# Patient Record
Sex: Female | Born: 1975 | Race: Black or African American | Hispanic: No | Marital: Married | Smoking: Current every day smoker
Health system: Southern US, Community
[De-identification: ages and names within clinical notes are randomized; demographics above are authoritative.]

## PROBLEM LIST (undated history)

## (undated) DIAGNOSIS — I10 Essential (primary) hypertension: Secondary | ICD-10-CM

## (undated) HISTORY — PX: CHOLECYSTECTOMY: SHX55

---

## 1998-03-16 ENCOUNTER — Other Ambulatory Visit: Admission: RE | Admit: 1998-03-16 | Discharge: 1998-03-16 | Payer: Self-pay | Admitting: *Deleted

## 1998-12-27 ENCOUNTER — Other Ambulatory Visit: Admission: RE | Admit: 1998-12-27 | Discharge: 1998-12-27 | Payer: Self-pay | Admitting: *Deleted

## 1999-01-16 ENCOUNTER — Encounter: Admission: RE | Admit: 1999-01-16 | Discharge: 1999-04-16 | Payer: Self-pay | Admitting: Obstetrics & Gynecology

## 1999-06-26 ENCOUNTER — Inpatient Hospital Stay (HOSPITAL_COMMUNITY): Admission: AD | Admit: 1999-06-26 | Discharge: 1999-06-26 | Payer: Self-pay | Admitting: Obstetrics and Gynecology

## 1999-07-06 ENCOUNTER — Inpatient Hospital Stay (HOSPITAL_COMMUNITY): Admission: AD | Admit: 1999-07-06 | Discharge: 1999-07-09 | Payer: Self-pay | Admitting: *Deleted

## 1999-07-15 ENCOUNTER — Encounter: Admission: RE | Admit: 1999-07-15 | Discharge: 1999-10-13 | Payer: Self-pay | Admitting: *Deleted

## 1999-08-15 ENCOUNTER — Other Ambulatory Visit: Admission: RE | Admit: 1999-08-15 | Discharge: 1999-08-15 | Payer: Self-pay | Admitting: *Deleted

## 1999-12-28 ENCOUNTER — Inpatient Hospital Stay (HOSPITAL_COMMUNITY): Admission: EM | Admit: 1999-12-28 | Discharge: 1999-12-30 | Payer: Self-pay | Admitting: Emergency Medicine

## 1999-12-28 ENCOUNTER — Encounter (INDEPENDENT_AMBULATORY_CARE_PROVIDER_SITE_OTHER): Payer: Self-pay | Admitting: Specialist

## 2002-07-08 ENCOUNTER — Other Ambulatory Visit: Admission: RE | Admit: 2002-07-08 | Discharge: 2002-07-08 | Payer: Self-pay | Admitting: *Deleted

## 2003-06-26 ENCOUNTER — Emergency Department (HOSPITAL_COMMUNITY): Admission: EM | Admit: 2003-06-26 | Discharge: 2003-06-26 | Payer: Self-pay | Admitting: Emergency Medicine

## 2004-08-17 ENCOUNTER — Encounter (INDEPENDENT_AMBULATORY_CARE_PROVIDER_SITE_OTHER): Payer: Self-pay | Admitting: *Deleted

## 2004-08-17 ENCOUNTER — Inpatient Hospital Stay (HOSPITAL_COMMUNITY): Admission: RE | Admit: 2004-08-17 | Discharge: 2004-08-20 | Payer: Self-pay | Admitting: Obstetrics and Gynecology

## 2005-04-29 ENCOUNTER — Emergency Department (HOSPITAL_COMMUNITY): Admission: EM | Admit: 2005-04-29 | Discharge: 2005-04-29 | Payer: Self-pay | Admitting: Emergency Medicine

## 2005-09-10 ENCOUNTER — Emergency Department (HOSPITAL_COMMUNITY): Admission: EM | Admit: 2005-09-10 | Discharge: 2005-09-10 | Payer: Self-pay | Admitting: Emergency Medicine

## 2005-09-12 ENCOUNTER — Emergency Department (HOSPITAL_COMMUNITY): Admission: EM | Admit: 2005-09-12 | Discharge: 2005-09-12 | Payer: Self-pay | Admitting: Emergency Medicine

## 2007-10-22 ENCOUNTER — Other Ambulatory Visit: Admission: RE | Admit: 2007-10-22 | Discharge: 2007-10-22 | Payer: Self-pay | Admitting: Gynecology

## 2008-11-28 ENCOUNTER — Emergency Department (HOSPITAL_COMMUNITY): Admission: EM | Admit: 2008-11-28 | Discharge: 2008-11-28 | Payer: Self-pay | Admitting: Emergency Medicine

## 2010-04-30 ENCOUNTER — Emergency Department (HOSPITAL_COMMUNITY)
Admission: EM | Admit: 2010-04-30 | Discharge: 2010-04-30 | Payer: Self-pay | Source: Home / Self Care | Admitting: Family Medicine

## 2010-05-03 ENCOUNTER — Emergency Department (HOSPITAL_COMMUNITY)
Admission: EM | Admit: 2010-05-03 | Discharge: 2010-05-03 | Payer: Self-pay | Source: Home / Self Care | Admitting: Emergency Medicine

## 2010-07-30 LAB — BASIC METABOLIC PANEL
Chloride: 106 mEq/L (ref 96–112)
GFR calc non Af Amer: >60 mL/min (ref >60)
Potassium: 4.5 mEq/L (ref 3.5–5.1)
Sodium: 137 mEq/L (ref 135–145)

## 2010-07-30 LAB — DIFFERENTIAL
Basophils Relative: 1 % (ref 0–1)
Eosinophils Absolute: 0 10*3/uL (ref 0.0–0.7)
Eosinophils Relative: 0 % (ref 0–5)
Lymphocytes Relative: 38 % (ref 12–46)
Monocytes Relative: 6 % (ref 3–12)
Neutro Abs: 2.6 10*3/uL (ref 1.7–7.7)
Neutrophils Relative %: 55 % (ref 43–77)

## 2010-07-30 LAB — POCT CARDIAC MARKERS
CKMB, poc: <1 ng/mL — ABNORMAL LOW (ref 1.0–8.0)
Troponin i, poc: <0.05 ng/mL (ref 0.00–0.09)

## 2010-07-30 LAB — CBC
HCT: 26.8 % — ABNORMAL LOW (ref 36.0–46.0)
Hemoglobin: 7.7 g/dL — ABNORMAL LOW (ref 12.0–15.0)
MCV: 65.4 fL — ABNORMAL LOW (ref 78.0–100.0)
WBC: 4.9 10*3/uL (ref 4.0–10.5)

## 2010-10-05 NOTE — Op Note (Signed)
[facility]  Patient:    [name], [name]                     MRN: [id number] Proc. Date: 12/29/99 Adm. Date:  [id number] Disc. Date: [id number] Attending:  Charlton Haws CC:         [facility].   Operative Report  PREOPERATIVE DIAGNOSIS:  Early acute calculous cholecystitis.  POSTOPERATIVE DIAGNOSIS:  Early acute calculous cholecystitis.  OPERATION PERFORMED:  Laparoscopic cholecystectomy.  SURGEON:  Dr. Jamey Ripa.  ASSISTANT:  Dr. Zachery Dakins.  ANESTHESIA:  General endotracheal.  CLINICAL HISTORY:  The patient is a 35 year old whose had a several day history of abdominal pain which has been almost a daily basis. She was having pain all day on the day of admission, yesterday, with associated nausea and vomiting although feeling a little bit better by the time I saw her in the emergency room around 7 p.m.  She was admitted with known gallstones and thinking she was in the process of either having just a long biliary colic or developing early cholecystitis.  DESCRIPTION OF PROCEDURE:  The patient was brought to the operating room and after satisfactory general endotracheal anesthesia had been obtained, the abdomen was prepped and draped. I used 0.25% plain Marcaine at each incision and made an umbilical incision. The fascia was grasped with a Kocher and opened and the peritoneal cavity entered under direct vision. A pursestring was placed and the Hasson introduced and the abdomen insufflated to 15. The gallbladder was ______ distended, a little bit of edematous and minimally thick walled but no gangrene or other active inflammatory changes.  With the patient in reverse Trendelenburg and tilted to the left, 3 additional cannulas were placed in the usual positions. Omental adhesions were divided and pulled off of the gallbladder ______ exposed the area of the cystic duct. I saw the cystic artery behind the cystic duct and once  I had the cystic duct cleaned off and the triangle of Calot opened up enough to make sure there were no other ductal structures back here, I went ahead and divided the cystic duct after first putting a clip on it and opening it and making sure that no stones could milk out of the cystic duct. Two clips were placed on the stay side and the gallbladder divided. The anterior and posterior branches of the artery were divided with clips and the gallbladder removed from below to above. There were a couple of bleeding points along the edges of the gallbladder which were also clipped as we came out including one right off the very liver edge. Once the gallbladder was removed, I irrigated and made sure everything was dry. I brought the gallbladder out the umbilical port. There were multiple stones in the gallbladder and I had to open the gallbladder and manually break up the stones with sponge forceps and then I was able to extract the gallbladder intact. The umbilical port was manually occluded for a few moments so I irrigated again. When I removed the 2 lateral ports, the more lateral of the 2 had some arterial bleeding which I couldnt readily control with cautery so I introduced the Hasson and put a suture across the using the endoclose device and a 2-0 Vicryl. This controlled the bleeding nicely and I watched the area for several minutes while I continued to irrigate and make sure everything was as dry as possible. Once I felt that everything was dry, the umbilical port was  removed and tied with the pursestring tied down. The abdomen was deflated through the epigastric port. The skin was closed with 4-0 monocryl subcuticular plus Steri-Strips. The patient tolerated the procedure well. There were no operative complications. All counts were correct. DD:  12/29/99 TD:  12/31/99 Job: [id number]

## 2010-10-05 NOTE — H&P (Signed)
[facility]  Patient:    [name], [name]                     MRN: [id number] Adm. Date:  [id number] Attending:  Charlton Haws CC:         [facility]   History and Physical  CHIEF COMPLAINT:  Abdominal pain.  HISTORY OF PRESENT ILLNESS:  Ms. [name] is a 35 year old seen by me in the emergency room for abdominal pain, nausea and vomiting.  This current episode began about 8:30 this morning with epigastric pain, bloating, and nausea, and every time she tries to eat, she has to throw it back up.  By the time she got to the emergency room this afternoon and I had seen her, it had begun to ease off somewhat.  The nausea is also somewhat better.  She notes that she first began having some problems shortly after the birth of her second child, which was five months ago and occurred by C-section.  The pain began as just some mild epigastric discomfort and ingestion, gas, bloatedness that would last about an hour, and she could not relate it to any particular types of foods that she ate.  After the last couple of weeks, it has become increasingly more significant, and this week she has been having almost daily episodes.  She had a gallbladder ultrasound done two days ago, which reportedly shows gallstones but no evidence of acute cholecystitis.  She was scheduled to see one of my associates, Dr. Samuella Cota, in the office next week, but because of increasing pain today is seen in the emergency department.  PAST MEDICAL HISTORY:  Operations:  C-section x 2.  MEDICATIONS:  Birth control pills.  ALLERGIES:  None known.  SOCIAL HISTORY:  Smokes occasionally.  Alcohol:  None.  FAMILY HISTORY:  Positive in her mother and sister for gallbladder disease, and the father has heart disease.  REVIEW OF SYSTEMS:  HEENT negative.  CHEST:  No cough or shortness of breath. HEART:  No history of murmurs or hypertension.  ABDOMEN:  Negative except  for HPI.  GU:  Negative.  EXTREMITIES:  Negative.  PHYSICAL EXAMINATION:  GENERAL:  The patient is a healthy 35 year old who is currently in no distress.  She was awake, alert and oriented.  HEENT:  The head is normocephalic.  The eyes are nonicteric.  NECK:  Supple.  There are no masses or thyromegaly.  LUNGS:  Clear to auscultation.  HEART:  Regular, no murmurs, rubs, or gallops are heard.  ABDOMEN:  Soft and nondistended.  There is minimal tenderness to deep palpation of the right upper quadrant but very minimal, and there is no guarding or rebound.  No masses are palpable, no hepatosplenomegaly noted.  No other masses noted within the abdomen.  EXTREMITIES:  Normal with no cyanosis or edema.  Pulses intact.  LABORATORY DATA:  So far available is only a white count of 8400 with a hemoglobin of 9.7.  IMPRESSION: 1. Abdominal pain secondary to biliary colic and gallstones. 2. Mild anemia.  PLAN:  We will want to do a cholecystectomy on this lady and will try to get this done fairly soon but wish to wait on timing to see how she does this evening and to see what her CMET, amylase lab results are. DD:  12/28/99 TD:  12/29/99 Job: [id number]

## 2010-10-05 NOTE — H&P (Signed)
[facility]  Patient:    [name], [name]                   MRN: [id number] Adm. Date:  [id number] Attending:  Donne Hazel                         History and Physical  HISTORY OF PRESENT ILLNESS:   Ms. Danish is a 35 year old female gravida 2 para 1 at term, admitted for repeat cesarean section.  Her previous cesarean section was for CPD.  She declined a trial of labor, and is thus admitted for repeat cesarean delivery.  Her pregnancy has been uncomplicated.  OBSTETRICAL LABORATORY:       Maternal blood type is B positive, rubella immune. Group B strep was positive.  Glucola was normal.  MEDICAL HISTORY:              None.  SURGICAL HISTORY:             Cesarean section - 1997.  OBSTETRICAL HISTORY:          Cesarean section in 1997 for CPD.  CURRENT MEDICATIONS:          Prenatal vitamins.  ALLERGIES:                    None known.  PHYSICAL EXAMINATION:  VITAL SIGNS:                  Stable, blood pressure 143/80, temperature 97.3, fetal heart tones 136.  GENERAL:                      She is a well-developed, well-nourished female in no acute distress.  HEENT:                        Within normal limits.  NECK:                         Supple without adenopathy or thyromegaly.  HEART AND LUNGS:              Clear.  BREAST:                       Deferred.  ABDOMEN:                      Gravid and nontender.  EXTREMITIES AND NEUROLOGIC:   Grossly normal.  PELVIC:                       Deferred.  ADMITTING DIAGNOSES:          1. Intrauterine pregnancy at term.                               2. Repeat cesarean section.                               3. Declined a trial of labor.  PLAN:                         Repeat low transverse cesarean section.  DISCUSSION:  Risks and benefits of the surgery explained to the the patient.  She was allowed to ask questions and wished to proceed.  She declined a  trial of labor and bilateral tubal ligation. DD:  07/06/99 TD:  07/06/99 Job: [id number]

## 2010-10-05 NOTE — Op Note (Signed)
[name], [name]            ACCOUNT NO.:  [id number]   MEDICAL RECORD NO.:  [id number]          PATIENT TYPE:  INP   LOCATION:  9198                          FACILITY:  [facility]   PHYSICIAN:  Richardean Sale, M.D.   DATE OF BIRTH:  1975/12/23   DATE OF PROCEDURE:  08/17/2004  DATE OF DISCHARGE:                                 OPERATIVE REPORT   PREOPERATIVE DIAGNOSES:  1.  Thirty-nine plus intrauterine pregnancy, for repeat cesarean section.  2.  Desires permanent sterilization.   POSTOPERATIVE DIAGNOSES:  1.  Thirty-nine plus intrauterine pregnancy, for repeat cesarean section.  2.  Desires permanent sterilization.   PROCEDURES:  1.  Repeat cesarean section.  2.  Bilateral tubal ligation.   SURGEON:  Richardean Sale, M.D.   ASSISTANT:  Pershing Cox, M.D.   ANESTHESIA:  Spinal.   COMPLICATIONS:  None.   ESTIMATED BLOOD LOSS:  700 mL.   IV FLUIDS:  3000 mL.   URINE OUTPUT:  125 mL clear urine at the end of the procedure.   SPECIMENS:  Placenta sent to pathology.   OPERATIVE FINDINGS:  A viable female infant with Apgars of 9 and 9, vertex.  Intact placenta with three-vessel cord.  Arterial cord pH of 7.3.  Normal-  appearing ovaries and fallopian tubes with very thin adhesions surrounding  the fallopian tubes.  Very dense scarring of the rectus muscles and the  fascia from prior surgery.  Very thin lower uterine segment.   INDICATIONS:  This is a 35 year old gravida 3, para 2-0-0-2, black female at  39+ weeks' gestation by last menstrual period consistent with ultrasound,  who presents today for repeat cesarean section.  The patient also desires  permanent sterilization, as she desires no future childbearing.  The patient  has been counseled on the risks and benefits of cesarean section, which  include but are not limited to hemorrhage requiring transfusion, infection,  injury to the bowel or the bladder or the ureters or other intra-abdominal  organs which  could require additional surgery, anesthetic related  complications and DVT.  Of note, we also discussed the risks of tubal  ligation, which include all of the above as well as a potential failure rate  of 1%.  The patient voiced an understanding of all these risks, and informed  consent has been obtained.   PROCEDURE:  The patient was taken to the operating room, where she was given  a spinal anesthetic.  She was then prepped and draped in the usual sterile  fashion and placed in the supine position with a leftward tilt.  Marcaine  0.25% 10 mL were injected over the patient's previous Pfannenstiel skin  incision.  A Pfannenstiel skin incision was then made through this previous  scar with the scalpel.  This was carried down sharply to the fascia.  The  fascia was then incised bilaterally with the Mayo scissors and the superior  and inferior aspect of the fascial incision were grasped with Kocher clamps,  elevated, and the underlying rectus muscles were dissected off with the Mayo  scissors and use of the Bovie.  There was  some thick scar of the superior  aspect of the fascia to the rectus muscles, and the rectus muscles were  tightly scarred together.  Therefore, a knife was then used to very  carefully incise the rectus muscles in the midline until the muscle could be  separated and the peritoneum could be identified.  The peritoneum was  entered with a knife.  The rectus muscles were then adequately separated in  the midline using sharp dissection to separate the scar.  The peritoneal  incision was then extended superiorly and inferiorly with good visualization  of the bladder.  The bladder blade was then reinserted and the vesicouterine  peritoneum was identified, grasped with pick-ups, and entered sharply with  Metzenbaum scissors.  This incision was then extended laterally and bladder  flap created digitally.   The bladder blade was then reinserted.  The lower uterine segment was   inspected and was very thin.  This was incised in a transverse fashion with  the scalpel and was extended manually.  The bladder blade was then removed  and the infant's head was then delivered through the incision  atraumatically.  The nose and mouth were suctioned with the bulb and the  infant was then delivered to the sterile field and the cord was clamped and  cut.  The infant was then handed off to the waiting NICU attendants with a  vigorous cry.  Cord gas was sent, which was 7.3.  Apgars were 9 and 9.  Cord  blood was obtained.  The placenta was then removed and sent to pathology.  The uterus was then carefully inspected and any remaining placental  fragments were removed from the uterine cavity.  At this point the uterus  was exteriorized and the uterus was cleared of all clot and debris.  The  uterine incision was then repaired in a running locked fashion with Chromic  suture.  A second layer of the same suture was then placed in an imbricating  fashion to help support the suture line given that the lower uterine segment  was extremely thin.  Once the uterine incision was closed and the incision  was hemostatic, attention was then turned to the tubal ligation.  Both  ovaries appeared normal.  The fallopian tubes were normal.  There were some  very thin, filmy adhesions of the fallopian tubes to the ovaries.  These  were removed with the Bovie and were hemostatic.  Tubal ligation was then  performed by grasping each tube in the midportion and carrying it out to the  fimbriated end.  The Bovie was then used to make a window in the  mesosalpinx, and a 3 cm segment of the tube was then tied off on both sides  and the tube was removed with the Metzenbaum scissors.  These specimens were  then sent to pathology labeled as portion of left fallopian tube and  portion of right fallopian tube.  The tubal ligation site was hemostatic. The uterine incision was hemostatic, and the uterus was then  returned to the  abdomen.  The pelvis was then irrigated copiously with warm normal saline.  The uterine incision was inspected and was hemostatic.  The peritoneal  surfaces were inspected and any areas of bleeding were then cauterized with  the Bovie.  The muscles and subfascial areas were inspected.  Any areas of  bleeding were cauterized with Bovie.  At this point when hemostasis was  assured, the fascia was then closed in a running  fashion with Vicryl suture.  The subcutaneous space was then irrigated.  Any areas of bleeding were  cauterized with the Bovie and the skin incision was then closed with  staples.   The patient tolerated the procedure well.  All sponge, lap and instruments  were correct x2.  She was taken to the recovery room awake and in stable  condition, and there were no complications.  Infant and mother are doing  well at the time of this dictation.      JW/MEDQ  D:  08/17/2004  T:  08/17/2004  Job:  [id number]

## 2010-10-05 NOTE — Discharge Summary (Signed)
[name], [name]            ACCOUNT NO.:  [id number]   MEDICAL RECORD NO.:  [id number]          PATIENT TYPE:  INP   LOCATION:  9105                          FACILITY:  [facility]   PHYSICIAN:  Richardean Sale, M.D.   DATE OF BIRTH:  Oct 22, 1975   DATE OF ADMISSION:  08/17/2004  DATE OF DISCHARGE:  08/20/2004                                 DISCHARGE SUMMARY   ADMISSION DIAGNOSES:  1.  39+ week intrauterine pregnancy.  2.  For repeat cesarean section.   DISCHARGE DIAGNOSES:  1.  39+ week intrauterine pregnancy.  2.  For repeat cesarean section.   PROCEDURE:  Repeat cesarean section with tubal ligation on August 17, 2004.   FINDINGS:  A viable female infant with Apgars of 9 and 9.  Normal placenta  and arterial cord pH of 7.3.   HISTORY OF PRESENT ILLNESS:  Please see admission history and physical for  complete details.  Briefly, this is a 35 year old African-American female,  gravida 3, para 2-0-0-2, admitted at [redacted] weeks gestation for repeat cesarean  section.  The patient also voiced desire for permanent sterilization.   HOSPITAL COURSE:  The patient underwent an uncomplicated repeat cesarean  section with bilateral partial salpingectomy on August 17, 2004.  The  patient's postoperative course was unremarkable.  She remained afebrile  throughout her hospitalization.  On postoperative day #3, she was  ambulating, voiding without difficulty, tolerating a regular diet, and her  pain was controlled with oral pain medication.  She was discharged to home  on postoperative day #3 in good condition.   DISPOSITION:  To home.   CONDITION ON DISCHARGE:  Good.   FOLLOW UP:  The patient will follow up in 4 to 6 weeks for routine  postoperative visit.   DISCHARGE MEDICATIONS:  1.  Percocet one to two tablets p.o. q.4-6h p.r.n. pain.  2.  Motrin 800 mg p.o. q.8h p.r.n. pain.  3.  Ferrous sulfate one tablet p.o. daily.   DISCHARGE LABORATORY DATA:  Hemoglobin 8.0, hematocrit 23.9,  platelet count  225, white count 15.4.      JW/MEDQ  D:  09/22/2004  T:  09/23/2004  Job:  [id number]

## 2010-10-05 NOTE — H&P (Signed)
[name], [name]            ACCOUNT NO.:  [id number]   MEDICAL RECORD NO.:  [id number]          PATIENT TYPE:  INP   LOCATION:  NA                            FACILITY[facility]  [facility]   PHYSICIAN:  [name], M.D.   DATE OF BIRTH:  1976/04/05   DATE OF ADMISSION:  DATE OF DISCHARGE:                                HISTORY & PHYSICAL   DATE OF PLANNED PROCEDURE:  August 17, 2004   PREOPERATIVE DIAGNOSIS:  Thirty-nine-plus-week intrauterine pregnancy with  history of cesarean section x2, unexplained elevated alpha-fetoprotein, and  desires permanent sterilization; for repeat cesarean section.   PLANNED PROCEDURE:  Repeat cesarean section with bilateral tubal ligation.   HISTORY OF PRESENT ILLNESS:  This is a 35 year old gravida 3 para 2-0-0-2  black female at 39+ weeks gestation by last menstrual period consistent with  ultrasound who presents for repeat cesarean section. The patient has a  history of two prior cesarean sections and also desires permanent  sterilization. The patient's pregnancy has been complicated by an  unexplained elevated AFP. She has undergone weekly testing with nonstress  tests in the third trimester, all of which have been reactive. Ultrasound  has shown appropriate growth with no anatomical abnormalities. The patient  was offered amniocentesis for diagnosis of elevated AFP but declined.  Prenatal care has been at [facility] with Dr. Richardean Keller as the  primary attending. Today, the patient reports good movement, denies loss of  fluid or vaginal bleeding, and complains of rare mild contractions.   PAST OBSTETRIC HISTORY:  1.  Gravida 1:  C-section at 39+ weeks for failure to progress.  2.  Gravida 2:  Repeat C-section, no complications.  3.  Gravida 3:  Current. History of positive group B beta strep.   GYNECOLOGIC HISTORY:  Remote history of abnormal Pap smear, no treatment  needed. No history of sexually-transmitted infections.   PAST MEDICAL  HISTORY:  History of gestational anemia. No prior  hospitalizations with the exception of cesarean section and gallbladder  surgery.   PAST SURGICAL HISTORY:  1.  C-section x2.  2.  Cholecystectomy.   FAMILY HISTORY:  Daughter has sickle cell trait. The patient's hemoglobin  electrophoresis was normal. Positive history of hypertension, coronary  artery disease, type 2 diabetes, stroke, and ovarian cancer in an aunt. No  known birth defects, congenital anomalies, trisomy, spinal bifida, or cystic  fibrosis.   SOCIAL HISTORY:  Former smoker. Denies alcohol or drugs during pregnancy. Is  married.   MEDICATIONS:  Prenatal vitamins.   ALLERGIES:  No known drug allergies.   DATA:  Prenatal laboratory studies:  Blood type is B positive, antibody  screen negative. Sickle cell screen within normal limits. RPR nonreactive.  Rubella immune. Hepatitis B surface antigen nonreactive. HIV nonreactive.  One-hour Glucola 121. Group B beta strep positive. Triple screen showed  borderline elevated AFP at 2.19 multiples of median; Down's syndrome risk  and trisomy 18 risk were not elevated. Repeat AFP was 2.13 multiples of  median, which is still borderline.   PHYSICAL EXAMINATION:  VITAL SIGNS:  She is afebrile, vital signs are  stable.  GENERAL:  She is a well-developed, well-nourished black female who is no  acute distress.  HEART:  Regular rate and rhythm.  LUNGS:  Clear to auscultation.  ABDOMEN:  Gravid, fundal height is 39.  PELVIC:  Cervix is 1 cm, 70% effaced, -1 station, and vertex.  EXTREMITIES:  1+ edema bilaterally, nontender.  NEUROLOGIC:  Nonfocal.   ASSESSMENT:  A 35 year old gravida 3 para 2 black female at 39+ weeks  gestation with history of cesarean section x2 for repeat cesarean section  and permanent sterilization.   PLAN:  Will proceed with repeat cesarean section and permanent  sterilization. Risks/benefits of the surgery is reviewed with the patient in  detail. We  discussed risks of hemorrhage, infection, injury to the bowel or  the bladder or other abdominal organs which could require additional  surgery. Reviewed anesthetic-related complications and reviewed the  possibility of tubal failure and slightly increased risk of ectopic  pregnancy should pregnancy occur. Reviewed with the patient that tubal  ligation is permanent. The patient voiced an understanding of all the above  and desires to proceed. Informed consent obtained.      JW/MEDQ  D:  08/15/2004  T:  08/15/2004  Job:  [id number]

## 2010-10-05 NOTE — Op Note (Signed)
[facility]  Patient:    SPRING, SAN                   MRN: [id number] Proc. Date: 07/06/99 Adm. Date:  [id number] Attending:  Donne Hazel                           Operative Report  PREOPERATIVE DIAGNOSES:       1. Intrauterine pregnancy at term.                               2. Repeat cesarean section.  POSTOPERATIVE DIAGNOSES:      1. Intrauterine pregnancy at term.                               2. Repeat cesarean section.  OPERATION:                    Repeat low transverse cesarean section.  SURGEON:                      Willey Blade, M.D.  ANESTHESIA:                   Spinal.  ESTIMATED BLOOD LOSS:         800 cc.  COMPLICATIONS:                None.  FINDINGS:                     At 1328 through a low transverse uterine incision, a viable female infant was delivered with Apgars of 8 and 9.  Weight of 7 pounds nd 13 ounces.  The pelvis was visualized at the time of surgery and noted to be normal.  DESCRIPTION OF PROCEDURE:     The patient was taken to the operating room where a spinal anesthetic was administered.  The patient was placed on the operating table in the left lateral tilt position.  The abdomen was prepped and draped in the usual sterile fashion with Betadine and sterile drapes.  A Foley catheter was sterilely inserted.  The abdomen was entered.  A Pfannenstiel incision was carried down sharply in the usual fashion.  The peritoneum was atraumatically entered.  The vesicouterine peritoneum overlying the lower uterine segment was incised and a bladder flap was bluntly and sharply created over the lower uterine segment.  A  bladder blade was then placed behind the bladder.  The uterus was then entered through a low transverse incision and carried out laterally in the operators fingers.  The membranes were entered with clear fluid noted.  The vertex was elevated into the incision and delivered properly at  1328.  The oral and nasopharynx was thoroughly bulb suctioned.  The cord doubly clamped and cut, and the baby handed promptly to the pediatrician.  The baby was a female weighing  7 pounds and 13 ounces.  Apgars were 8 and 9.                                The placenta was then manually distracted intact ith three vessel cord without difficulty.  The interior of the uterus was wiped clean thoroughly with a wet  sponge.  The uterine incision was then closed in a two layered fashion.  The first layer a running interlocking suture of #1 Vicryl.  second imbricating suture was placed across the primary suture line with a running stitch of #1 Vicryl as well.  The pelvis was then thoroughly irrigated with copious amounts of irrigant and hemostasis was noted.  The rectus muscle and anterior peritoneum were then closed with a running stitch of #1 Vicryl.  Good hemostasis was noted in the subfascial layers.  The fascia was then closed with two sutures of #1 Vicryl.  This was done by placing two sutures ________ incision and with two  running stitches meeting in the midline.  The subcutaneous tissue was irrigated and made hemostatic using Bovie cautery.  The skin reapproximated with staples and  sterile dressing applied.                                Final sponge, needle, and instrument count was correct x 3.  The patient received 1 g of Cefotan after cord clamp.  There were no perioperative complications. DD:  07/06/99 TD:  07/08/99 Job: [id number]

## 2013-08-09 ENCOUNTER — Encounter (HOSPITAL_COMMUNITY): Payer: Self-pay | Admitting: Emergency Medicine

## 2013-08-09 ENCOUNTER — Emergency Department (HOSPITAL_COMMUNITY)
Admission: EM | Admit: 2013-08-09 | Discharge: 2013-08-09 | Disposition: A | Discharge status: Home / Self Care | Payer: 59 | Source: Home / Self Care | Attending: Family Medicine | Admitting: Family Medicine

## 2013-08-09 DIAGNOSIS — R599 Enlarged lymph nodes, unspecified: Secondary | ICD-10-CM

## 2013-08-09 DIAGNOSIS — R590 Localized enlarged lymph nodes: Secondary | ICD-10-CM

## 2013-08-09 HISTORY — DX: Essential (primary) hypertension: I10

## 2013-08-09 NOTE — ED Provider Notes (Signed)
Susan SledgeJennifer Keller is a [age] y.o. female who presents to Urgent Care today for tender lymph node behind the left ear present for one day. No cough congestion runny nose fevers or chills. Patient feels well otherwise. No night sweats or unexpected weight loss. Patient feels well otherwise.   Past Medical History  Diagnosis Date  . Hypertension    History  Substance Use Topics  . Smoking status: Current Every Day Smoker  . Smokeless tobacco: Not on file  . Alcohol Use: Yes   ROS as above Medications: No current facility-administered medications for this encounter.   Current Outpatient Prescriptions  Medication Sig Dispense Refill  . lisinopril-hydrochlorothiazide (PRINZIDE,ZESTORETIC) 10-12.5 MG per tablet Take 1 tablet by mouth daily.        Exam:  BP 150/85  Pulse 68  Temp(Src) 98.6 F (37 C) (Oral)  Resp 14  SpO2 100% Gen: Well NAD HEENT: EOMI,  MMM small left-sided posterior regular lymph node mildly tender. Less than 1 cm in diameter. History pharynx and tympanic membranes are normal. No scalp rashes. Lungs: Normal work of breathing. CTABL Heart: RRR no MRG Abd: NABS, Soft. NT, ND Exts: Brisk capillary refill, warm and well perfused.    Assessment and Plan: 38 y.o. female with lymphadenopathy. Unclear etiology. Followup with primary care provider.  Discussed warning signs or symptoms. Please see discharge instructions. Patient expresses understanding.    Rodolph BongEvan S Corey, MD 08/09/13 1321

## 2013-08-09 NOTE — ED Notes (Signed)
C/o noted painful lump on her left mastoid area yesterday. Denies any other area causing any pain or discomfort

## 2013-08-09 NOTE — Discharge Instructions (Signed)
Thank you for coming in today. You have a reactive lymph node.  This is normal.  Come back or go to your regular Dr. if it gets a lot bigger or starts changing a lot.    Lymphadenopathy Lymphadenopathy means "disease of the lymph glands." But the term is usually used to describe swollen or enlarged lymph glands, also called lymph nodes. These are the bean-shaped organs found in many locations including the neck, underarm, and groin. Lymph glands are part of the immune system, which fights infections in your body. Lymphadenopathy can occur in just one area of the body, such as the neck, or it can be generalized, with lymph node enlargement in several areas. The nodes found in the neck are the most common sites of lymphadenopathy. CAUSES  When your immune system responds to germs (such as viruses or bacteria ), infection-fighting cells and fluid build up. This causes the glands to grow in size. This is usually not something to worry about. Sometimes, the glands themselves can become infected and inflamed. This is called lymphadenitis. Enlarged lymph nodes can be caused by many diseases:  Bacterial disease, such as strep throat or a skin infection.  Viral disease, such as a common cold.  Other germs, such as lyme disease, tuberculosis, or sexually transmitted diseases.  Cancers, such as lymphoma (cancer of the lymphatic system) or leukemia (cancer of the white blood cells).  Inflammatory diseases such as lupus or rheumatoid arthritis.  Reactions to medications. Many of the diseases above are rare, but important. This is why you should see your caregiver if you have lymphadenopathy. SYMPTOMS   Swollen, enlarged lumps in the neck, back of the head or other locations.  Tenderness.  Warmth or redness of the skin over the lymph nodes.  Fever. DIAGNOSIS  Enlarged lymph nodes are often near the source of infection. They can help healthcare providers diagnose your illness. For instance:    Swollen lymph nodes around the jaw might be caused by an infection in the mouth.  Enlarged glands in the neck often signal a throat infection.  Lymph nodes that are swollen in more than one area often indicate an illness caused by a virus. Your caregiver most likely will know what is causing your lymphadenopathy after listening to your history and examining you. Blood tests, x-rays or other tests may be needed. If the cause of the enlarged lymph node cannot be found, and it does not go away by itself, then a biopsy may be needed. Your caregiver will discuss this with you. TREATMENT  Treatment for your enlarged lymph nodes will depend on the cause. Many times the nodes will shrink to normal size by themselves, with no treatment. Antibiotics or other medicines may be needed for infection. Only take over-the-counter or prescription medicines for pain, discomfort or fever as directed by your caregiver. HOME CARE INSTRUCTIONS  Swollen lymph glands usually return to normal when the underlying medical condition goes away. If they persist, contact your health-care provider. He/she might prescribe antibiotics or other treatments, depending on the diagnosis. Take any medications exactly as prescribed. Keep any follow-up appointments made to check on the condition of your enlarged nodes.  SEEK MEDICAL CARE IF:   Swelling lasts for more than two weeks.  You have symptoms such as weight loss, night sweats, fatigue or fever that does not go away.  The lymph nodes are hard, seem fixed to the skin or are growing rapidly.  Skin over the lymph nodes is red and inflamed.  This could mean there is an infection. SEEK IMMEDIATE MEDICAL CARE IF:   Fluid starts leaking from the area of the enlarged lymph node.  You develop a fever of 102 F (38.9 C) or greater.  Severe pain develops (not necessarily at the site of a large lymph node).  You develop chest pain or shortness of breath.  You develop worsening  abdominal pain. MAKE SURE YOU:   Understand these instructions.  Will watch your condition.  Will get help right away if you are not doing well or get worse. Document Released: 02/13/2008 Document Revised: 07/29/2011 Document Reviewed: 02/13/2008 [facility] Patient Information 2014 [place], [place].

## 2014-05-08 ENCOUNTER — Emergency Department (HOSPITAL_COMMUNITY): Payer: 59

## 2014-05-08 ENCOUNTER — Encounter (HOSPITAL_COMMUNITY): Payer: Self-pay | Admitting: Emergency Medicine

## 2014-05-08 ENCOUNTER — Emergency Department (HOSPITAL_COMMUNITY)
Admission: EM | Admit: 2014-05-08 | Discharge: 2014-05-08 | Disposition: A | Discharge status: Home / Self Care | Payer: 59 | Attending: Emergency Medicine | Admitting: Emergency Medicine

## 2014-05-08 DIAGNOSIS — Z720 Tobacco use: Secondary | ICD-10-CM | POA: Insufficient documentation

## 2014-05-08 DIAGNOSIS — R1011 Right upper quadrant pain: Secondary | ICD-10-CM | POA: Diagnosis present

## 2014-05-08 DIAGNOSIS — K5909 Other constipation: Secondary | ICD-10-CM | POA: Insufficient documentation

## 2014-05-08 DIAGNOSIS — I10 Essential (primary) hypertension: Secondary | ICD-10-CM | POA: Insufficient documentation

## 2014-05-08 DIAGNOSIS — Z79899 Other long term (current) drug therapy: Secondary | ICD-10-CM | POA: Diagnosis not present

## 2014-05-08 DIAGNOSIS — R109 Unspecified abdominal pain: Secondary | ICD-10-CM

## 2014-05-08 MED ORDER — POLYETHYLENE GLYCOL 3350 17 G PO PACK: 17.0000 g | PACK | Freq: Every day | ORAL | Status: AC

## 2014-05-08 NOTE — ED Provider Notes (Signed)
CSN: [id number]     Arrival date & time 05/08/14  [id number] History   First MD Initiated Contact with Patient 05/08/14 [phone]     Chief Complaint  Patient presents with  . Abdominal Pain     (Consider location/radiation/quality/duration/timing/severity/associated sxs/prior Treatment) Patient is a 38 y.o. female presenting with abdominal pain. The history is provided by the patient. No language interpreter was used.  Abdominal Pain Pain location:  Epigastric and RUQ Pain quality: aching   Pain radiates to:  Does not radiate Pain severity:  Moderate Onset quality:  Gradual Duration: 18 Month. Timing:  Constant Progression:  Worsening Chronicity:  New Relieved by:  Nothing Worsened by:  Nothing tried Ineffective treatments:  None tried Associated symptoms: no chest pain, no nausea and no vomiting   Risk factors: no alcohol abuse     Past Medical History  Diagnosis Date  . Hypertension    Past Surgical History  Procedure Laterality Date  . Cesarean section    . Cholecystectomy     History reviewed. No pertinent family history. History  Substance Use Topics  . Smoking status: Current Every Day Smoker -- 1.00 packs/day    Types: Cigarettes  . Smokeless tobacco: Not on file  . Alcohol Use: Yes   OB History    No data available     Review of Systems  Cardiovascular: Negative for chest pain.  Gastrointestinal: Positive for abdominal pain. Negative for nausea and vomiting.      Allergies  Review of patient's allergies indicates no known allergies.  Home Medications   Prior to Admission medications   Medication Sig Start Date End Date Taking? Authorizing Provider  lisinopril-hydrochlorothiazide (PRINZIDE,ZESTORETIC) 10-12.5 MG per tablet Take 1 tablet by mouth daily.    Historical Provider, MD   BP 141/83 mmHg  Pulse 98  Temp(Src) 98.9 F (37.2 C) (Oral)  Resp 20  Ht 5\' 4"  (1.626 m)  Wt 228 lb (103.42 kg)  BMI 39.12 kg/m2  SpO2 100%  LMP 04/25/2014 Physical  Exam  Constitutional: She is oriented to person, place, and time. She appears well-developed and well-nourished.  HENT:  Head: Normocephalic.  Eyes: EOM are normal. Pupils are equal, round, and reactive to light.  Neck: Normal range of motion.  Cardiovascular: Normal heart sounds.   Pulmonary/Chest: Effort normal and breath sounds normal.  Abdominal: Soft. She exhibits no distension.  Musculoskeletal: Normal range of motion.  Neurological: She is alert and oriented to person, place, and time.  Skin: Skin is warm.  Psychiatric: She has a normal mood and affect.  Nursing note and vitals reviewed.   ED Course  Procedures (including critical care time) Labs Review Labs Reviewed - No data to display  Imaging Review No results found.   EKG Interpretation None      MDM   Final diagnoses:  Abdominal pain  Other constipation    Pt given rx for miralax I advised schedule to see Gi doctor for evaluation    Elson AreasLeslie K Marieli Rudy, PA-C 05/08/14 1051  Rolland PorterMark James, MD 05/09/14 [phone]

## 2014-05-08 NOTE — ED Notes (Signed)
Patient transported to X-ray 

## 2014-05-08 NOTE — ED Notes (Signed)
Patient returned from X-ray 

## 2014-05-08 NOTE — ED Notes (Addendum)
Stomach pain on left side that feels shooting and straining from side over to umbilicus. Has been having stomach pain for a year and a half. PCP diagnosed acid reflux. Nothing new or different about the pain today, but she feels like it is more than reflux and wants the pain to be evaluated by a different doctor other than PCP. Denies N/V/D. Reports frequent constipation. Denies pain at this time; it is intermittent.

## 2014-05-08 NOTE — Discharge Instructions (Signed)
Abdominal Pain, Women °Abdominal (stomach, pelvic, or belly) pain can be caused by many things. It is important to tell your doctor: °· The location of the pain. °· Does it come and go or is it present all the time? °· Are there things that start the pain (eating certain foods, exercise)? °· Are there other symptoms associated with the pain (fever, nausea, vomiting, diarrhea)? °All of this is helpful to know when trying to find the cause of the pain. °CAUSES  °· Stomach: virus or bacteria infection, or ulcer. °· Intestine: appendicitis (inflamed appendix), regional ileitis (Crohn's disease), ulcerative colitis (inflamed colon), irritable bowel syndrome, diverticulitis (inflamed diverticulum of the colon), or cancer of the stomach or intestine. °· Gallbladder disease or stones in the gallbladder. °· Kidney disease, kidney stones, or infection. °· Pancreas infection or cancer. °· Fibromyalgia (pain disorder). °· Diseases of the female organs: °¨ Uterus: fibroid (non-cancerous) tumors or infection. °¨ Fallopian tubes: infection or tubal pregnancy. °¨ Ovary: cysts or tumors. °¨ Pelvic adhesions (scar tissue). °¨ Endometriosis (uterus lining tissue growing in the pelvis and on the pelvic organs). °¨ Pelvic congestion syndrome (female organs filling up with blood just before the menstrual period). °¨ Pain with the menstrual period. °¨ Pain with ovulation (producing an egg). °¨ Pain with an IUD (intrauterine device, birth control) in the uterus. °¨ Cancer of the female organs. °· Functional pain (pain not caused by a disease, may improve without treatment). °· Psychological pain. °· Depression. °DIAGNOSIS  °Your doctor will decide the seriousness of your pain by doing an examination. °· Blood tests. °· X-rays. °· Ultrasound. °· CT scan (computed tomography, special type of X-ray). °· MRI (magnetic resonance imaging). °· Cultures, for infection. °· Barium enema (dye inserted in the large intestine, to better view it with  X-rays). °· Colonoscopy (looking in intestine with a lighted tube). °· Laparoscopy (minor surgery, looking in abdomen with a lighted tube). °· Major abdominal exploratory surgery (looking in abdomen with a large incision). °TREATMENT  °The treatment will depend on the cause of the pain.  °· Many cases can be observed and treated at home. °· Over-the-counter medicines recommended by your caregiver. °· Prescription medicine. °· Antibiotics, for infection. °· Birth control pills, for painful periods or for ovulation pain. °· Hormone treatment, for endometriosis. °· Nerve blocking injections. °· Physical therapy. °· Antidepressants. °· Counseling with a psychologist or psychiatrist. °· Minor or major surgery. °HOME CARE INSTRUCTIONS  °· Do not take laxatives, unless directed by your caregiver. °· Take over-the-counter pain medicine only if ordered by your caregiver. Do not take aspirin because it can cause an upset stomach or bleeding. °· Try a clear liquid diet (broth or water) as ordered by your caregiver. Slowly move to a bland diet, as tolerated, if the pain is related to the stomach or intestine. °· Have a thermometer and take your temperature several times a day, and record it. °· Bed rest and sleep, if it helps the pain. °· Avoid sexual intercourse, if it causes pain. °· Avoid stressful situations. °· Keep your follow-up appointments and tests, as your caregiver orders. °· If the pain does not go away with medicine or surgery, you may try: °¨ Acupuncture. °¨ Relaxation exercises (yoga, meditation). °¨ Group therapy. °¨ Counseling. °SEEK MEDICAL CARE IF:  °· You notice certain foods cause stomach pain. °· Your home care treatment is not helping your pain. °· You need stronger pain medicine. °· You want your IUD removed. °· You feel faint or   lightheaded. °· You develop nausea and vomiting. °· You develop a rash. °· You are having side effects or an allergy to your medicine. °SEEK IMMEDIATE MEDICAL CARE IF:  °· Your  pain does not go away or gets worse. °· You have a fever. °· Your pain is felt only in portions of the abdomen. The right side could possibly be appendicitis. The left lower portion of the abdomen could be colitis or diverticulitis. °· You are passing blood in your stools (bright red or black tarry stools, with or without vomiting). °· You have blood in your urine. °· You develop chills, with or without a fever. °· You pass out. °MAKE SURE YOU:  °· Understand these instructions. °· Will watch your condition. °· Will get help right away if you are not doing well or get worse. °Document Released: 03/03/2007 Document Revised: 09/20/2013 Document Reviewed: 03/23/2009 °[employer]® Patient Information ©2015 [employer]. This information is not intended to replace advice given to you by your health care provider. Make sure you discuss any questions you have with your health care provider. ° °Constipation °Constipation is when a person has fewer than three bowel movements a week, has difficulty having a bowel movement, or has stools that are dry, hard, or larger than normal. As people grow older, constipation is more common. If you try to fix constipation with medicines that make you have a bowel movement (laxatives), the problem may get worse. Long-term laxative use may cause the muscles of the colon to become weak. A low-fiber diet, not taking in enough fluids, and taking certain medicines may make constipation worse.  °CAUSES  °· Certain medicines, such as antidepressants, pain medicine, iron supplements, antacids, and water pills.   °· Certain diseases, such as diabetes, irritable bowel syndrome (IBS), thyroid disease, or depression.   °· Not drinking enough water.   °· Not eating enough fiber-rich foods.   °· Stress or travel.   °· Lack of physical activity or exercise.   °· Ignoring the urge to have a bowel movement.   °· Using laxatives too much.   °SIGNS AND SYMPTOMS  °· Having fewer than three bowel movements a  week.   °· Straining to have a bowel movement.   °· Having stools that are hard, dry, or larger than normal.   °· Feeling full or bloated.   °· Pain in the lower abdomen.   °· Not feeling relief after having a bowel movement.   °DIAGNOSIS  °Your health care provider will take a medical history and perform a physical exam. Further testing may be done for severe constipation. Some tests may include: °· A barium enema X-ray to examine your rectum, colon, and, sometimes, your small intestine.   °· A sigmoidoscopy to examine your lower colon.   °· A colonoscopy to examine your entire colon. °TREATMENT  °Treatment will depend on the severity of your constipation and what is causing it. Some dietary treatments include drinking more fluids and eating more fiber-rich foods. Lifestyle treatments may include regular exercise. If these diet and lifestyle recommendations do not help, your health care provider may recommend taking over-the-counter laxative medicines to help you have bowel movements. Prescription medicines may be prescribed if over-the-counter medicines do not work.  °HOME CARE INSTRUCTIONS  °· Eat foods that have a lot of fiber, such as fruits, vegetables, whole grains, and beans. °· Limit foods high in fat and processed sugars, such as french fries, hamburgers, cookies, candies, and soda.   °· A fiber supplement may be added to your diet if you cannot get enough fiber from foods.   °·   Drink enough fluids to keep your urine clear or pale yellow.   °· Exercise regularly or as directed by your health care provider.   °· Go to the restroom when you have the urge to go. Do not hold it.   °· Only take over-the-counter or prescription medicines as directed by your health care provider. Do not take other medicines for constipation without talking to your health care provider first.   °SEEK IMMEDIATE MEDICAL CARE IF:  °· You have bright red blood in your stool.   °· Your constipation lasts for more than 4 days or gets  worse.   °· You have abdominal or rectal pain.   °· You have thin, pencil-like stools.   °· You have unexplained weight loss. °MAKE SURE YOU:  °· Understand these instructions. °· Will watch your condition. °· Will get help right away if you are not doing well or get worse. °Document Released: 02/02/2004 Document Revised: 05/11/2013 Document Reviewed: 02/15/2013 °ExitCare® Patient Information ©2015 [employer]. This information is not intended to replace advice given to you by your health care provider. Make sure you discuss any questions you have with your health care provider. ° °

## 2018-06-10 ENCOUNTER — Ambulatory Visit (HOSPITAL_COMMUNITY)
Admission: EM | Admit: 2018-06-10 | Discharge: 2018-06-10 | Disposition: A | Discharge status: Home / Self Care | Payer: 59 | Attending: Family Medicine | Admitting: Family Medicine

## 2018-06-10 ENCOUNTER — Encounter (HOSPITAL_COMMUNITY): Payer: Self-pay | Admitting: Emergency Medicine

## 2018-06-10 DIAGNOSIS — R69 Illness, unspecified: Secondary | ICD-10-CM | POA: Insufficient documentation

## 2018-06-10 DIAGNOSIS — R05 Cough: Secondary | ICD-10-CM | POA: Insufficient documentation

## 2018-06-10 DIAGNOSIS — J111 Influenza due to unidentified influenza virus with other respiratory manifestations: Secondary | ICD-10-CM

## 2018-06-10 DIAGNOSIS — R059 Cough, unspecified: Secondary | ICD-10-CM

## 2018-06-10 MED ORDER — HYDROCODONE-HOMATROPINE 5-1.5 MG/5ML PO SYRP: 5.0000 mL | ORAL_SOLUTION | Freq: Four times a day (QID) | ORAL | 0 refills | Status: AC | PRN

## 2018-06-10 NOTE — ED Notes (Signed)
Patient able to ambulate independently  

## 2018-06-10 NOTE — Discharge Instructions (Signed)

## 2018-06-10 NOTE — ED Provider Notes (Signed)
[facility] CARE CENTER   [id number] 06/10/18 Arrival Time: 1143  ASSESSMENT & PLAN:  1. Influenza-like illness   2. Cough    Work note given. See AVS for discharge instructions.  Meds ordered this encounter  Medications  . HYDROcodone-homatropine (HYCODAN) 5-1.5 MG/5ML syrup    Sig: Take 5 mLs by mouth every 6 (six) hours as needed for cough.    Dispense:  90 mL    Refill:  0   Cough medication sedation precautions. Discussed typical duration of symptoms. OTC symptom care as needed. Ensure adequate fluid intake and rest. May f/u with PCP or here as needed.  Reviewed expectations re: course of current medical issues. Questions answered. Outlined signs and symptoms indicating need for more acute intervention. Patient verbalized understanding. After Visit Summary given.   SUBJECTIVE: History from: patient.  [name] is a 43 y.o. female who presents with complaint of nasal congestion, post-nasal drainage, and a persistent dry cough; without sore throat. Onset abrupt, about 4 days ago; with fatigue and with body aches. SOB: none. Wheezing: none. Fever: yes, subjective. Overall normal PO intake without n/v. Known sick contacts: no. No specific or significant aggravating or alleviating factors reported. OTC treatment: cold medicines without relief.  Received flu shot this year: no.  Social History   Tobacco Use  Smoking Status Current Every Day Smoker  . Packs/day: 1.00  . Types: Cigarettes    ROS: As per HPI.   OBJECTIVE:  Vitals:   06/10/18 1251  BP: 136/85  Pulse: 80  Resp: 18  Temp: 97.6 F (36.4 C)  TempSrc: Temporal  SpO2: 100%     General appearance: alert; appears fatigued HEENT: nasal congestion; clear runny nose; throat irritation secondary to post-nasal drainage Neck: supple without LAD CV: RRR Lungs: unlabored respirations, symmetrical air entry without wheezing; cough: moderate Abd: soft Ext: no LE edema Skin: warm and  dry Psychological: alert and cooperative; normal mood and affect   No Known Allergies  Past Medical History:  Diagnosis Date  . Hypertension     Social History   Socioeconomic History  . Marital status: Married    Spouse name: Not on file  . Number of children: Not on file  . Years of education: Not on file  . Highest education level: Not on file  Occupational History  . Not on file  Social Needs  . Financial resource strain: Not on file  . Food insecurity:    Worry: Not on file    Inability: Not on file  . Transportation needs:    Medical: Not on file    Non-medical: Not on file  Tobacco Use  . Smoking status: Current Every Day Smoker    Packs/day: 1.00    Types: Cigarettes  Substance and Sexual Activity  . Alcohol use: Yes  . Drug use: No  . Sexual activity: Not on file  Lifestyle  . Physical activity:    Days per week: Not on file    Minutes per session: Not on file  . Stress: Not on file  Relationships  . Social connections:    Talks on phone: Not on file    Gets together: Not on file    Attends religious service: Not on file    Active member of club or organization: Not on file    Attends meetings of clubs or organizations: Not on file    Relationship status: Not on file  . Intimate partner violence:    Fear of current or ex partner:  Not on file    Emotionally abused: Not on file    Physically abused: Not on file    Forced sexual activity: Not on file  Other Topics Concern  . Not on file  Social History Narrative  . Not on file           Mardella Layman, MD 06/11/18 [phone]

## 2018-06-10 NOTE — ED Triage Notes (Signed)
Pt sts URI and body aches x 4 days

## 2019-04-08 ENCOUNTER — Other Ambulatory Visit: Payer: Self-pay

## 2019-04-08 DIAGNOSIS — Z20822 Contact with and (suspected) exposure to covid-19: Secondary | ICD-10-CM

## 2019-04-11 LAB — NOVEL CORONAVIRUS, NAA: SARS-CoV-2, NAA: NOT DETECTED

## 2019-05-13 ENCOUNTER — Other Ambulatory Visit: Payer: Self-pay

## 2019-05-13 ENCOUNTER — Encounter (HOSPITAL_COMMUNITY): Payer: Self-pay | Admitting: Emergency Medicine

## 2019-05-13 ENCOUNTER — Emergency Department (HOSPITAL_COMMUNITY)
Admission: EM | Admit: 2019-05-13 | Discharge: 2019-05-14 | Disposition: A | Discharge status: Home / Self Care | Payer: Managed Care, Other (non HMO) | Attending: Emergency Medicine | Admitting: Emergency Medicine

## 2019-05-13 DIAGNOSIS — R6883 Chills (without fever): Secondary | ICD-10-CM | POA: Diagnosis not present

## 2019-05-13 DIAGNOSIS — Z20828 Contact with and (suspected) exposure to other viral communicable diseases: Secondary | ICD-10-CM | POA: Diagnosis not present

## 2019-05-13 DIAGNOSIS — R55 Syncope and collapse: Secondary | ICD-10-CM | POA: Insufficient documentation

## 2019-05-13 DIAGNOSIS — R5383 Other fatigue: Secondary | ICD-10-CM | POA: Diagnosis present

## 2019-05-13 DIAGNOSIS — E876 Hypokalemia: Secondary | ICD-10-CM | POA: Diagnosis not present

## 2019-05-13 DIAGNOSIS — F17210 Nicotine dependence, cigarettes, uncomplicated: Secondary | ICD-10-CM | POA: Diagnosis not present

## 2019-05-13 DIAGNOSIS — R0789 Other chest pain: Secondary | ICD-10-CM | POA: Insufficient documentation

## 2019-05-13 DIAGNOSIS — R531 Weakness: Secondary | ICD-10-CM | POA: Diagnosis not present

## 2019-05-13 DIAGNOSIS — Z79899 Other long term (current) drug therapy: Secondary | ICD-10-CM | POA: Diagnosis not present

## 2019-05-13 NOTE — ED Triage Notes (Signed)
Patient reports chest tightness with dizziness and fatigue  onset today , patient stated her spouse tested positive for Covid 19 and currently at [facility] .

## 2019-05-14 ENCOUNTER — Emergency Department (HOSPITAL_COMMUNITY): Payer: Managed Care, Other (non HMO)

## 2019-05-14 ENCOUNTER — Encounter (HOSPITAL_COMMUNITY): Payer: Self-pay | Admitting: Radiology

## 2019-05-14 LAB — CBC WITH DIFFERENTIAL/PLATELET
Abs Immature Granulocytes: 0.04 10*3/uL (ref 0.00–0.07)
Basophils Absolute: 0 10*3/uL (ref 0.0–0.1)
Basophils Relative: 0 %
Eosinophils Absolute: 0.1 10*3/uL (ref 0.0–0.5)
Eosinophils Relative: 1 %
HCT: 36.8 % (ref 36.0–46.0)
Hemoglobin: 12.3 g/dL (ref 12.0–15.0)
Immature Granulocytes: 0 %
Lymphocytes Relative: 25 %
Lymphs Abs: 2.7 10*3/uL (ref 0.7–4.0)
MCH: 25.9 pg — ABNORMAL LOW (ref 26.0–34.0)
MCHC: 33.4 g/dL (ref 30.0–36.0)
MCV: 77.5 fL — ABNORMAL LOW (ref 80.0–100.0)
Monocytes Absolute: 0.7 10*3/uL (ref 0.1–1.0)
Monocytes Relative: 6 %
Neutro Abs: 7.1 10*3/uL (ref 1.7–7.7)
Neutrophils Relative %: 68 %
Platelets: 257 10*3/uL (ref 150–400)
RBC: 4.75 MIL/uL (ref 3.87–5.11)
RDW: 13.4 % (ref 11.5–15.5)
WBC: 10.6 10*3/uL — ABNORMAL HIGH (ref 4.0–10.5)
nRBC: 0 % (ref 0.0–0.2)

## 2019-05-14 LAB — BASIC METABOLIC PANEL
Anion gap: 13 (ref 5–15)
BUN: 6 mg/dL (ref 6–20)
CO2: 20 mmol/L — ABNORMAL LOW (ref 22–32)
Calcium: 9.5 mg/dL (ref 8.9–10.3)
Chloride: 97 mmol/L — ABNORMAL LOW (ref 98–111)
Creatinine, Ser: 0.92 mg/dL (ref 0.44–1.00)
GFR calc Af Amer: >60 mL/min (ref >60)
GFR calc non Af Amer: >60 mL/min (ref >60)
Glucose, Bld: 105 mg/dL — ABNORMAL HIGH (ref 70–99)
Potassium: 3.2 mmol/L — ABNORMAL LOW (ref 3.5–5.1)
Sodium: 130 mmol/L — ABNORMAL LOW (ref 135–145)

## 2019-05-14 LAB — I-STAT BETA HCG BLOOD, ED (MC, WL, AP ONLY): I-stat hCG, quantitative: <5 m[IU]/mL (ref <5)

## 2019-05-14 LAB — TROPONIN I (HIGH SENSITIVITY)
Troponin I (High Sensitivity): <2 ng/L (ref <18)
Troponin I (High Sensitivity): <2 ng/L (ref <18)

## 2019-05-14 MED ORDER — POTASSIUM CHLORIDE ER 10 MEQ PO TBCR: 10.0000 meq | EXTENDED_RELEASE_TABLET | Freq: Every day | ORAL | 0 refills | Status: AC

## 2019-05-14 MED ORDER — ACETAMINOPHEN 500 MG PO TABS
1000.0000 mg | ORAL_TABLET | Freq: Once | ORAL | Status: AC
  Administered 2019-05-14: 05:00:00 1000 mg via ORAL
  Filled 2019-05-14: qty 2

## 2019-05-14 MED ORDER — IOHEXOL 350 MG/ML SOLN
80.0000 mL | Freq: Once | INTRAVENOUS | Status: AC | PRN
  Administered 2019-05-14: 80 mL via INTRAVENOUS

## 2019-05-14 MED ORDER — SODIUM CHLORIDE 0.9 % IV BOLUS
500.0000 mL | Freq: Once | INTRAVENOUS | Status: AC
  Administered 2019-05-14: 06:00:00 500 mL via INTRAVENOUS

## 2019-05-14 MED ORDER — POTASSIUM CHLORIDE CRYS ER 20 MEQ PO TBCR
40.0000 meq | EXTENDED_RELEASE_TABLET | Freq: Once | ORAL | Status: AC
  Administered 2019-05-14: 07:00:00 40 meq via ORAL
  Filled 2019-05-14: qty 2

## 2019-05-14 NOTE — Discharge Instructions (Addendum)
1. Medications: Klor-con; usual home medications 2. Treatment: rest, drink plenty of fluids, eat 3 nutritious meals per day 3. Follow Up: Please followup with your primary doctor in 3 days for discussion of your diagnoses and further evaluation after today's visit; if you do not have a primary care doctor use the resource guide provided to find one; Please return to the ER for new or worsening symptoms, return of near syncope, development of chest pain, shortness of breath, high fevers or other concerns.

## 2019-05-14 NOTE — ED Notes (Signed)
Patient verbalizes understanding of discharge instructions. Opportunity for questioning and answers were provided. Armband removed by staff, pt discharged from ED.  

## 2019-05-14 NOTE — ED Provider Notes (Signed)
[facility] EMERGENCY DEPARTMENT Provider Note   CSN: [id number] Arrival date & time: 05/13/19  2345     History Chief Complaint  Patient presents with  . Chest Tightness / Dizzy    [name] is a 43 y.o. female with a hx of HTN presents to the Emergency Department complaining of gradual, persistent, progressively worsening fatigue, chills, generalized weakness onset this morning.  Pt reports her husband was dx with COVID Nov 29th and worsened 12/17.  He was admitted to [facility] at that time.  She initially had symptoms in later Nov, but was never tested.  Pt was feeling better until 1 week ago when she developed intermittent chills.  No other symptoms until today.  Pt reports today she was sitting in a chair when she became dizzy and weak.  She did not pass out, have vision changes, numbness, loss bowel/bladder control.  Since that time she has felt fatigued.  She reports feeling a "swelling" in her chest when she attempted to lay down.  She was not ever specifically short of breath.  She reports taking a rapid COVID test at [facility] urgent care this afternoon that was negative.  Pt reports she came to the ER because she continues to feel as if something is "not right." Pt reports she has not been eating much due top the stress.  Pt takes progesterone BC.  Denies hx of DVT/PE, cancer or lupus.  No recent surgeries or periods of immobilization.     The history is provided by the patient and medical records. No language interpreter was used.       Past Medical History:  Diagnosis Date  . Hypertension     There are no problems to display for this patient.   Past Surgical History:  Procedure Laterality Date  . CESAREAN SECTION    . CHOLECYSTECTOMY       OB History   No obstetric history on file.     No family history on file.  Social History   Tobacco Use  . Smoking status: Current Every Day Smoker    Packs/day: 1.00    Types: Cigarettes  Substance Use  Topics  . Alcohol use: Yes  . Drug use: No    Home Medications Prior to Admission medications   Medication Sig Start Date End Date Taking? Authorizing Provider  HYDROcodone-homatropine (HYCODAN) 5-1.5 MG/5ML syrup Take 5 mLs by mouth every 6 (six) hours as needed for cough. 06/10/18   Mardella Layman, MD  lisinopril-hydrochlorothiazide (PRINZIDE,ZESTORETIC) 20-12.5 MG per tablet Take 1 tablet by mouth daily.    [provider]  omeprazole (PRILOSEC) 40 MG capsule Take 40 mg by mouth daily.    [provider]  polyethylene glycol (MIRALAX) packet Take 17 g by mouth daily. 05/08/14   Elson Areas, PA-C  potassium chloride (KLOR-CON) 10 MEQ tablet Take 1 tablet (10 mEq total) by mouth daily for 3 days. 05/14/19 05/17/19  Naquita Nappier, Dahlia Client, PA-C    Allergies    Patient has no known allergies.  Review of Systems   Review of Systems  Constitutional: Positive for chills and fatigue. Negative for appetite change, diaphoresis, fever and unexpected weight change.  HENT: Negative for mouth sores.   Eyes: Negative for visual disturbance.  Respiratory: Positive for chest tightness and shortness of breath. Negative for cough and wheezing.   Cardiovascular: Negative for chest pain.  Gastrointestinal: Negative for abdominal pain, constipation, diarrhea, nausea and vomiting.  Endocrine: Negative for polydipsia, polyphagia and polyuria.  Genitourinary: Negative for dysuria, frequency, hematuria and urgency.  Musculoskeletal: Negative for back pain and neck stiffness.  Skin: Negative for rash.  Allergic/Immunologic: Negative for immunocompromised state.  Neurological: Positive for dizziness, weakness ( generalized) and light-headedness. Negative for syncope and headaches.  Hematological: Does not bruise/bleed easily.  Psychiatric/Behavioral: Negative for sleep disturbance. The patient is nervous/anxious.     Physical Exam Updated Vital Signs BP 130/89 (BP Location: Left Arm)    Pulse 88   Temp 98.4 F (36.9 C) (Oral)   Resp 17   SpO2 97%   Physical Exam Vitals and nursing note reviewed.  Constitutional:      General: She is not in acute distress.    Appearance: She is not diaphoretic.  HENT:     Head: Normocephalic.  Eyes:     General: No scleral icterus.    Conjunctiva/sclera: Conjunctivae normal.  Cardiovascular:     Rate and Rhythm: Normal rate and regular rhythm.     Pulses: Normal pulses.          Radial pulses are 2+ on the right side and 2+ on the left side.     Comments: No tachycardia at rest in bed however patient becomes mildly tachycardic with sitting up or moving around. Pulmonary:     Effort: No tachypnea, accessory muscle usage, prolonged expiration, respiratory distress or retractions.     Breath sounds: No stridor.     Comments: Equal chest rise. No increased work of breathing. Abdominal:     General: There is no distension.     Palpations: Abdomen is soft.     Tenderness: There is no abdominal tenderness. There is no guarding or rebound.  Musculoskeletal:     Cervical back: Normal range of motion.     Right lower leg: No edema.     Left lower leg: No edema.     Comments: Moves all extremities equally and without difficulty. No peripheral edema or calf tenderness.  Skin:    General: Skin is warm and dry.     Capillary Refill: Capillary refill takes less than 2 seconds.  Neurological:     Mental Status: She is alert.     GCS: GCS eye subscore is 4. GCS verbal subscore is 5. GCS motor subscore is 6.     Comments: Speech is clear and goal oriented.  Psychiatric:        Mood and Affect: Mood normal.     ED Results / Procedures / Treatments   Labs (all labs ordered are listed, but only abnormal results are displayed) Labs Reviewed  CBC WITH DIFFERENTIAL/PLATELET - Abnormal; Notable for the following components:      Result Value   WBC 10.6 (*)    MCV 77.5 (*)    MCH 25.9 (*)    All other components within normal limits    BASIC METABOLIC PANEL - Abnormal; Notable for the following components:   Sodium 130 (*)    Potassium 3.2 (*)    Chloride 97 (*)    CO2 20 (*)    Glucose, Bld 105 (*)    All other components within normal limits  I-STAT BETA HCG BLOOD, ED ([facility], [facility], [facility] ONLY)  TROPONIN I (HIGH SENSITIVITY)  TROPONIN I (HIGH SENSITIVITY)    EKG EKG Interpretation  Date/Time:  Thursday May 13 2019 23:52:54 EST Ventricular Rate:  106 PR Interval:  142 QRS Duration: 82 QT Interval:  330 QTC Calculation: 438 R Axis:   86 Text Interpretation: Sinus tachycardia  Nonspecific ST abnormality Abnormal ECG When compared with ECG of 05/03/2010, HEART RATE has increased Confirmed by Dione Booze ([id number]) on 05/14/2019 2:09:06 AM   Radiology CT Angio Chest PE W and/or Wo Contrast  Result Date: 05/14/2019 CLINICAL DATA:  Chest tightness with dizziness and fatigue beginning yesterday. High probability for pulmonary embolism. EXAM: CT ANGIOGRAPHY CHEST WITH CONTRAST TECHNIQUE: Multidetector CT imaging of the chest was performed using the standard protocol during bolus administration of intravenous contrast. Multiplanar CT image reconstructions and MIPs were obtained to evaluate the vascular anatomy. CONTRAST:  80mL OMNIPAQUE IOHEXOL 350 MG/ML SOLN COMPARISON:  None. FINDINGS: Cardiovascular: Heart is normal in size. Thoracic aorta is unremarkable. Pulmonary arterial system is well opacified without evidence of emboli. Remaining vascular structures are normal. Mediastinum/Nodes: No evidence of mediastinal or hilar adenopathy. Remaining mediastinal structures are normal. Lungs/Pleura: Lungs are well inflated without evidence of focal airspace consolidation or effusion. Airways are normal. Upper Abdomen: Previous cholecystectomy. Musculoskeletal: Unremarkable. Review of the MIP images confirms the above findings. IMPRESSION: No evidence of acute cardiopulmonary disease and no evidence of pulmonary embolism. Electronically  Signed   By: Elberta Fortis M.D.   On: 05/14/2019 05:06   DG Chest Portable 1 View  Result Date: 05/14/2019 CLINICAL DATA:  Chest tightness and dizziness, recent COVID exposure, negative testing today EXAM: PORTABLE CHEST 1 VIEW COMPARISON:  Radiograph 04/30/2010 FINDINGS: No consolidation, features of edema, pneumothorax, or effusion. Pulmonary vascularity is normally distributed. The cardiomediastinal contours are unremarkable. No acute osseous or soft tissue abnormality. IMPRESSION: No acute cardiopulmonary abnormality. Electronically Signed   By: Kreg Shropshire M.D.   On: 05/14/2019 00:13    Procedures Procedures (including critical care time)  Medications Ordered in ED Medications  sodium chloride 0.9 % bolus 500 mL (has no administration in time range)  potassium chloride SA (KLOR-CON) CR tablet 40 mEq (has no administration in time range)  acetaminophen (TYLENOL) tablet 1,000 mg (1,000 mg Oral Given 05/14/19 0433)  iohexol (OMNIPAQUE) 350 MG/ML injection 80 mL (80 mLs Intravenous Contrast Given 05/14/19 0440)    ED Course  I have reviewed the triage vital signs and the nursing notes.  Pertinent labs & imaging results that were available during my care of the patient were reviewed by me and considered in my medical decision making (see chart for details).  Clinical Course as of May 14 615  Fri May 14, 2019  0614 Hyponatremia, hypochloremia.  Suspect this is due to low p.o. intake.  Fluids given.  Sodium(!): 130 [HM]  0614 Hypokalemia noted.  Replaced here in the emergency department and will be replaced at home.  Potassium(!): 3.2 [HM]  0614 No evidence of pulmonary embolism  CT Angio Chest PE W and/or Wo Contrast [HM]  0614 No groundglass opacities  DG Chest Portable 1 View [HM]    Clinical Course User Index [HM] Elfrieda Espino, Boyd Kerbs   MDM Rules/Calculators/A&P                       [name] was evaluated in Emergency Department on 05/14/2019 for the  symptoms described in the history of present illness. She was evaluated in the context of the global COVID-19 pandemic, which necessitated consideration that the patient might be at risk for infection with the SARS-CoV-2 virus that causes COVID-19. Institutional protocols and algorithms that pertain to the evaluation of patients at risk for COVID-19 are in a state of rapid change based on information released by regulatory bodies including the CDC  and federal and state organizations. These policies and algorithms were followed during the patient's care in the ED.  Patient with near syncopal episode today and some shortness of breath.  She is also very anxious.  On arrival to the emergency department and patient with a heart rate of 137.  EKG with mild tachycardia.  Suspect she likely had Covid in late November.  She does take progesterone.  Concern for possible urinary embolism today given her tachycardia and shortness of breath.  Near syncopal episode is concerning.  EKG without ischemia and troponin is negative.  Doubt ACS as the cause of her symptoms today.  6:15 AM CT angio without evidence of pulmonary embolism.  Patient reports she is feeling okay.  Fluid bolus given.  Suspect symptoms today are secondary to lack of oral intake.  Discussed the importance of this.  Hypokalemia noted.  Potassium given here and patient was discharged home with short course of prescription.  Will need close primary care follow-up.     Final Clinical Impression(s) / ED Diagnoses Final diagnoses:  Near syncope  Hypokalemia    Rx / DC Orders ED Discharge Orders         Ordered    potassium chloride (KLOR-CON) 10 MEQ tablet  Daily     05/14/19 0612           Geza Beranek, Gwenlyn Perking 05/14/19 0616    Ward, Delice Bison, DO 05/14/19 [phone]

## 2019-05-15 ENCOUNTER — Emergency Department (HOSPITAL_COMMUNITY)
Admission: EM | Admit: 2019-05-15 | Discharge: 2019-05-16 | Disposition: A | Discharge status: Home / Self Care | Payer: Managed Care, Other (non HMO) | Attending: Emergency Medicine | Admitting: Emergency Medicine

## 2019-05-15 ENCOUNTER — Other Ambulatory Visit: Payer: Self-pay

## 2019-05-15 ENCOUNTER — Emergency Department (HOSPITAL_COMMUNITY): Payer: Managed Care, Other (non HMO)

## 2019-05-15 ENCOUNTER — Encounter (HOSPITAL_COMMUNITY): Payer: Self-pay | Admitting: *Deleted

## 2019-05-15 DIAGNOSIS — F17210 Nicotine dependence, cigarettes, uncomplicated: Secondary | ICD-10-CM | POA: Insufficient documentation

## 2019-05-15 DIAGNOSIS — R0602 Shortness of breath: Secondary | ICD-10-CM | POA: Diagnosis present

## 2019-05-15 DIAGNOSIS — Z9189 Other specified personal risk factors, not elsewhere classified: Secondary | ICD-10-CM | POA: Insufficient documentation

## 2019-05-15 DIAGNOSIS — F418 Other specified anxiety disorders: Secondary | ICD-10-CM | POA: Diagnosis not present

## 2019-05-15 DIAGNOSIS — E871 Hypo-osmolality and hyponatremia: Secondary | ICD-10-CM | POA: Insufficient documentation

## 2019-05-15 DIAGNOSIS — I10 Essential (primary) hypertension: Secondary | ICD-10-CM | POA: Diagnosis not present

## 2019-05-15 DIAGNOSIS — Z79899 Other long term (current) drug therapy: Secondary | ICD-10-CM | POA: Insufficient documentation

## 2019-05-15 LAB — CBC
HCT: 36.2 % (ref 36.0–46.0)
Hemoglobin: 12.1 g/dL (ref 12.0–15.0)
MCH: 26 pg (ref 26.0–34.0)
MCHC: 33.4 g/dL (ref 30.0–36.0)
MCV: 77.8 fL — ABNORMAL LOW (ref 80.0–100.0)
Platelets: 266 10*3/uL (ref 150–400)
RBC: 4.65 MIL/uL (ref 3.87–5.11)
RDW: 13.6 % (ref 11.5–15.5)
WBC: 11.2 10*3/uL — ABNORMAL HIGH (ref 4.0–10.5)
nRBC: 0 % (ref 0.0–0.2)

## 2019-05-15 LAB — BASIC METABOLIC PANEL
Anion gap: 13 (ref 5–15)
BUN: <5 mg/dL — ABNORMAL LOW (ref 6–20)
CO2: 21 mmol/L — ABNORMAL LOW (ref 22–32)
Calcium: 9.7 mg/dL (ref 8.9–10.3)
Chloride: 93 mmol/L — ABNORMAL LOW (ref 98–111)
Creatinine, Ser: 0.72 mg/dL (ref 0.44–1.00)
GFR calc Af Amer: >60 mL/min (ref >60)
GFR calc non Af Amer: >60 mL/min (ref >60)
Glucose, Bld: 108 mg/dL — ABNORMAL HIGH (ref 70–99)
Potassium: 3.2 mmol/L — ABNORMAL LOW (ref 3.5–5.1)
Sodium: 127 mmol/L — ABNORMAL LOW (ref 135–145)

## 2019-05-15 LAB — I-STAT BETA HCG BLOOD, ED (MC, WL, AP ONLY): I-stat hCG, quantitative: <5 m[IU]/mL (ref <5)

## 2019-05-15 LAB — TROPONIN I (HIGH SENSITIVITY)
Troponin I (High Sensitivity): 4 ng/L (ref <18)
Troponin I (High Sensitivity): 4 ng/L (ref <18)

## 2019-05-15 MED ORDER — SODIUM CHLORIDE 0.9% FLUSH: 3.0000 mL | Freq: Once | INTRAVENOUS | Status: DC

## 2019-05-15 NOTE — ED Triage Notes (Signed)
Pt states sob, numb extremities, L arm tingling and some chest pain.  States husband has been moved to ICU and she's expecting phone call that he has died.  Also states she just found out he's HIV and hep positive and that he was living a double life.

## 2019-05-16 MED ORDER — POTASSIUM CHLORIDE CRYS ER 20 MEQ PO TBCR
40.0000 meq | EXTENDED_RELEASE_TABLET | Freq: Once | ORAL | Status: AC
  Administered 2019-05-16: 40 meq via ORAL
  Filled 2019-05-16: qty 2

## 2019-05-16 MED ORDER — LORAZEPAM 1 MG PO TABS: 1.0000 mg | ORAL_TABLET | Freq: Three times a day (TID) | ORAL | 0 refills | Status: AC | PRN

## 2019-05-16 MED ORDER — LORAZEPAM 1 MG PO TABS
1.0000 mg | ORAL_TABLET | Freq: Once | ORAL | Status: AC
  Administered 2019-05-16: 1 mg via ORAL
  Filled 2019-05-16: qty 1

## 2019-05-16 NOTE — ED Provider Notes (Signed)
[facility] EMERGENCY DEPARTMENT Provider Note   CSN: [id number] Arrival date & time: 05/15/19  1625     History Chief Complaint  Patient presents with  . Shortness of Breath    [name] is a 43 y.o. female.  HPI     43 year old female with history of hypertension comes in a chief complaint of shortness of breath. She reports that earlier today she started having an episode of generalized tingling all over her body.  She also had some shortness of breath and mild tremor associated with it.  Her symptoms lasted for few seconds.  There was no associated chest pain, however she has had some chest discomfort off and on as well.  Patient was seen in the ED few days ago and had a negative CT PE and labs.  She has been tested for COVID-19 a couple of times over the last 1 month and they were both negative.  She states that her husband just passed away earlier today with COVID-19.  During his admission it was uncovered that he had HIV and hepatitis, conditions that she was unaware of and now she is anxious about possibly having acquired those conditions.  She has had trouble grieving as she is holding onto some of this information by herself.  She has a PCP appointment coming up next week.  She prefers being tested by her PCP.   Past Medical History:  Diagnosis Date  . Hypertension     There are no problems to display for this patient.   Past Surgical History:  Procedure Laterality Date  . CESAREAN SECTION    . CHOLECYSTECTOMY       OB History   No obstetric history on file.     No family history on file.  Social History   Tobacco Use  . Smoking status: Current Every Day Smoker    Packs/day: 1.00    Types: Cigarettes  . Smokeless tobacco: Never Used  Substance Use Topics  . Alcohol use: Yes  . Drug use: No    Home Medications Prior to Admission medications   Medication Sig Start Date End Date Taking? Authorizing Provider  albuterol  (VENTOLIN HFA) 108 (90 Base) MCG/ACT inhaler Inhale 1-2 puffs into the lungs every 4 (four) hours as needed for wheezing.  05/11/19  Yes [provider]  benzonatate (TESSALON) 200 MG capsule Take 200 mg by mouth 3 (three) times daily as needed for cough.  05/11/19  Yes [provider]  furosemide (LASIX) 20 MG tablet Take 20 mg by mouth daily as needed for fluid.  02/25/19  Yes [provider]  lisinopril-hydrochlorothiazide (PRINZIDE,ZESTORETIC) 20-12.5 MG per tablet Take 1 tablet by mouth daily.   Yes [provider]  medroxyPROGESTERone (PROVERA) 10 MG tablet Take 10 mg by mouth daily. 04/27/19  Yes [provider]  potassium chloride (KLOR-CON) 10 MEQ tablet Take 1 tablet (10 mEq total) by mouth daily for 3 days. 05/14/19 05/17/19 Yes Muthersbaugh, Dahlia Client, PA-C  HYDROcodone-homatropine (HYCODAN) 5-1.5 MG/5ML syrup Take 5 mLs by mouth every 6 (six) hours as needed for cough. Patient not taking: Reported on 05/16/2019 06/10/18   Mardella Layman, MD  LORazepam (ATIVAN) 1 MG tablet Take 1 tablet (1 mg total) by mouth 3 (three) times daily as needed for anxiety. 05/16/19   Derwood Kaplan, MD  polyethylene glycol (MIRALAX) packet Take 17 g by mouth daily. Patient not taking: Reported on 05/16/2019 05/08/14   Elson Areas, PA-C    Allergies  Patient has no known allergies.  Review of Systems   Review of Systems  Constitutional: Positive for activity change. Negative for chills and fever.  Respiratory: Positive for shortness of breath. Negative for cough.   Gastrointestinal: Negative for nausea and vomiting.  Neurological: Positive for tremors and numbness.  Psychiatric/Behavioral: Positive for sleep disturbance. The patient is nervous/anxious.     Physical Exam Updated Vital Signs BP (!) 153/73 (BP Location: Right Arm)   Pulse 83   Temp 98.6 F (37 C) (Oral)   Resp 18   Ht 5\' 4"  (1.626 m)   Wt 111.1 kg   LMP 05/15/2019   SpO2 100%   BMI  42.05 kg/m   Physical Exam Vitals and nursing note reviewed.  Constitutional:      Appearance: She is well-developed.  HENT:     Head: Normocephalic and atraumatic.  Cardiovascular:     Rate and Rhythm: Normal rate.  Pulmonary:     Effort: Pulmonary effort is normal.     Breath sounds: No decreased breath sounds, wheezing, rhonchi or rales.  Abdominal:     General: Bowel sounds are normal.  Musculoskeletal:     Cervical back: Normal range of motion and neck supple.  Skin:    General: Skin is warm and dry.  Neurological:     Mental Status: She is alert and oriented to person, place, and time.     ED Results / Procedures / Treatments   Labs (all labs ordered are listed, but only abnormal results are displayed) Labs Reviewed  BASIC METABOLIC PANEL - Abnormal; Notable for the following components:      Result Value   Sodium 127 (*)    Potassium 3.2 (*)    Chloride 93 (*)    CO2 21 (*)    Glucose, Bld 108 (*)    BUN <5 (*)    All other components within normal limits  CBC - Abnormal; Notable for the following components:   WBC 11.2 (*)    MCV 77.8 (*)    All other components within normal limits  I-STAT BETA HCG BLOOD, ED ([facility], [facility], [facility] ONLY)  TROPONIN I (HIGH SENSITIVITY)  TROPONIN I (HIGH SENSITIVITY)    EKG EKG Interpretation  Date/Time:  Saturday May 15 2019 16:44:30 EST Ventricular Rate:  105 PR Interval:  152 QRS Duration: 92 QT Interval:  344 QTC Calculation: 454 R Axis:   76 Text Interpretation: Sinus tachycardia Right atrial enlargement Borderline ECG No acute changes No significant change since last tracing Confirmed by Varney Biles [phone]) on 05/16/2019 2:20:15 AM   Radiology DG Chest Portable 1 View  Result Date: 05/15/2019 CLINICAL DATA:  Shortness of breath EXAM: PORTABLE CHEST 1 VIEW COMPARISON:  May 14, 2019 FINDINGS: The heart size and mediastinal contours are within normal limits. Both lungs are clear. The visualized skeletal  structures are unremarkable. IMPRESSION: No active disease. Electronically Signed   By: Prudencio Pair M.D.   On: 05/15/2019 22:40    Procedures Procedures (including critical care time)  Medications Ordered in ED Medications  LORazepam (ATIVAN) tablet 1 mg (1 mg Oral Given 05/16/19 0249)  potassium chloride SA (KLOR-CON) CR tablet 40 mEq (40 mEq Oral Given 05/16/19 0252)    ED Course  I have reviewed the triage vital signs and the nursing notes.  Pertinent labs & imaging results that were available during my care of the patient were reviewed by me and considered in my medical decision making (see chart for details).  MDM Rules/Calculators/A&P                        43 year old comes in a chief complaint of numbness, shortness of breath. She is currently symptom-free.  She is noted to have mild hyponatremia and hypokalemia.  It appears to me that her symptoms are likely driven by anxiety.  She does not have history of anxiety however she is going through a lot, and she is holding onto a lot of her grief because of the circumstances.  Patient will be discharged with Ativan.  She has a follow-up with her PCP on Monday.  I have advised her to seek counseling/therapy.  Final Clinical Impression(s) / ED Diagnoses Final diagnoses:  At high risk for complicated grieving  Hyponatremia  Anxiety about health    Rx / DC Orders ED Discharge Orders         Ordered    LORazepam (ATIVAN) 1 MG tablet  3 times daily PRN     05/16/19 0248           Derwood KaplanNanavati, Rikita Grabert, MD 05/16/19 [id number]

## 2019-05-16 NOTE — Discharge Instructions (Addendum)
Please take good care of yourself.  Please try to follow-up with your PCP on Monday as planned and seek assistance with therapy.  We are wishing the very best for you and your family.

## 2021-04-04 IMAGING — CT CT ANGIO CHEST
2 of 7 series · 17 of 46 positions shown · IV contrast (APPLIED)
Comparison: None.

CLINICAL DATA: Chest tightness with dizziness and fatigue beginning
yesterday. High probability for pulmonary embolism.

EXAM:
CT ANGIOGRAPHY CHEST WITH CONTRAST
TECHNIQUE: Multidetector CT imaging of the chest was performed using the
standard protocol during bolus administration of intravenous
contrast. Multiplanar CT image reconstructions and MIPs were
obtained to evaluate the vascular anatomy.
CONTRAST:  80mL OMNIPAQUE IOHEXOL 350 MG/ML SOLN

[Series 8: thins · axial · 0.77mm/px · z∈[+1097,+1343]mm · 14 of 397 slices shown]
[im 23/397  lung]
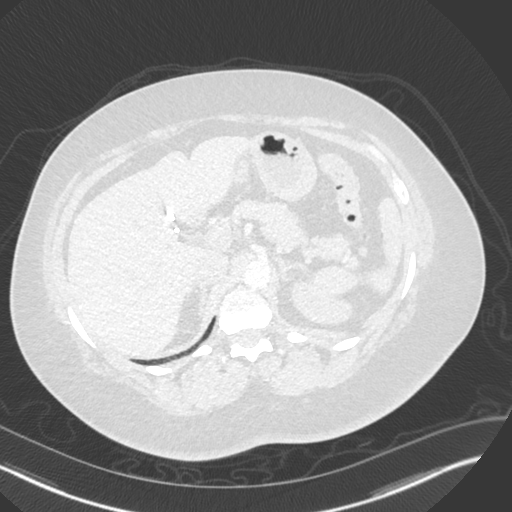
[im 45/397  soft-tissue]
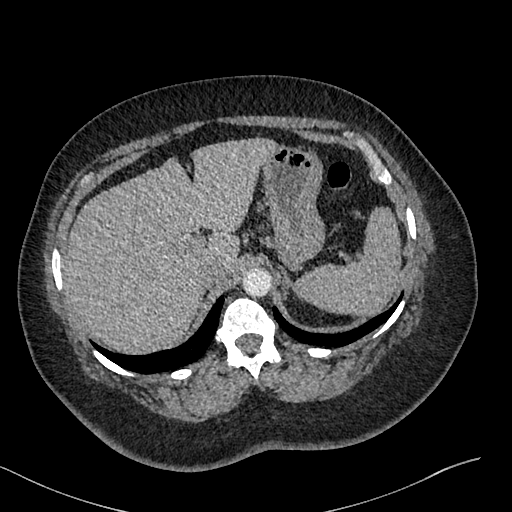
[im 89/397  lung]
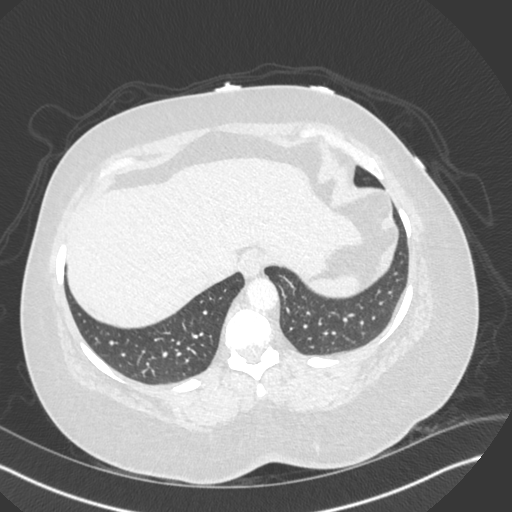
[im 111/397  soft-tissue]
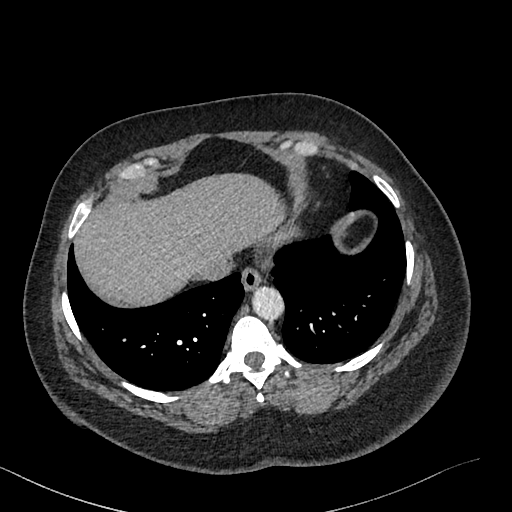
[im 133/397  lung]
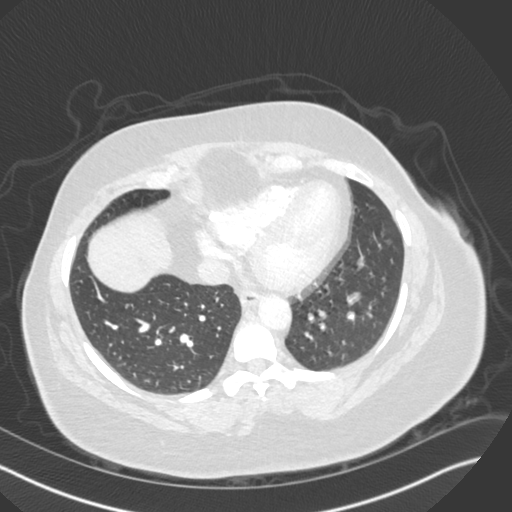
[im 155/397  soft-tissue]
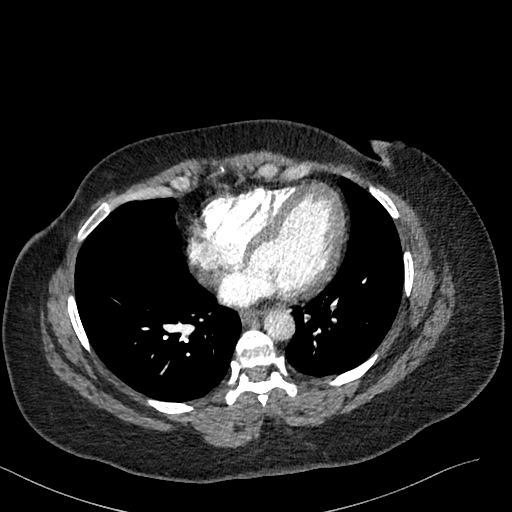
[im 177/397  lung]
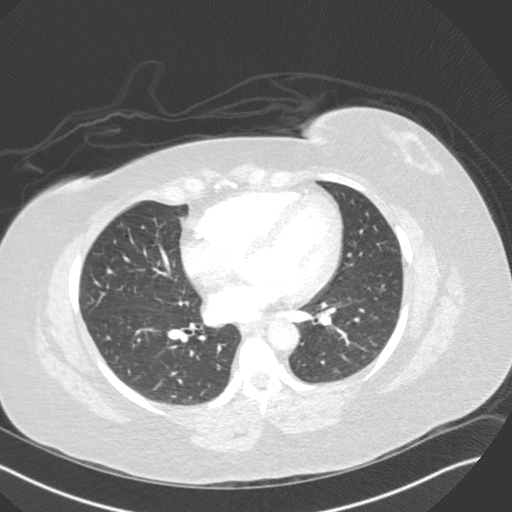
[im 221/397  soft-tissue]
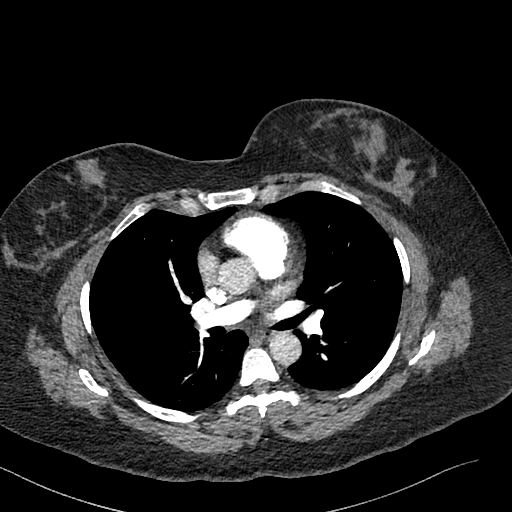
[im 243/397  lung]
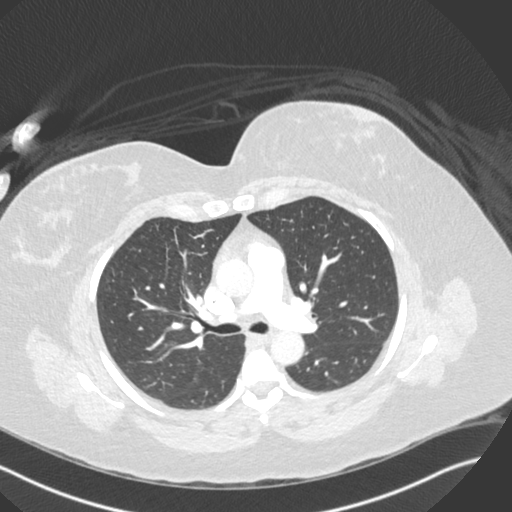
[im 265/397  soft-tissue]
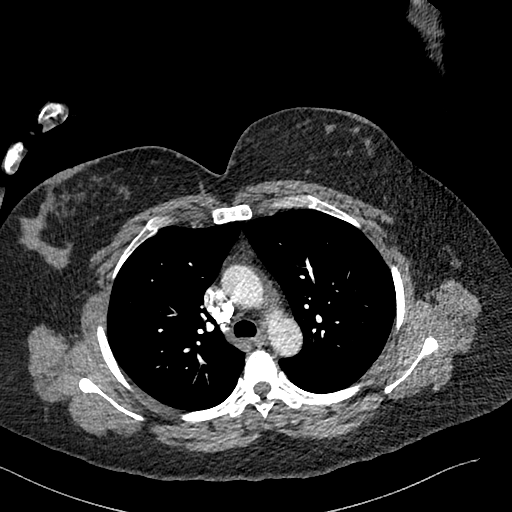
[im 287/397  lung]
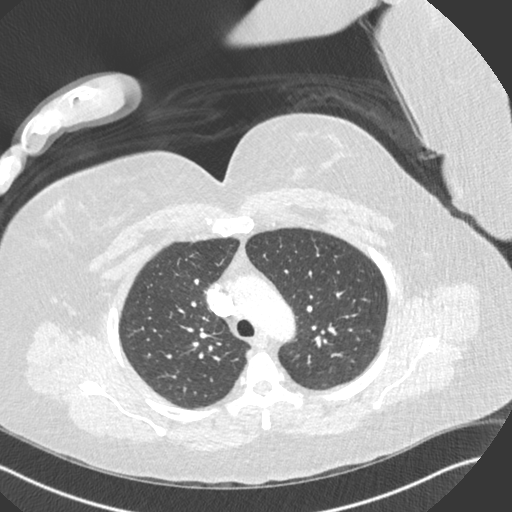
[im 309/397  soft-tissue]
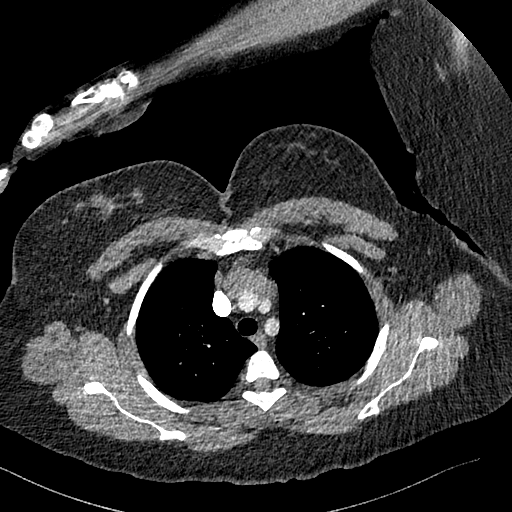
[im 353/397  lung]
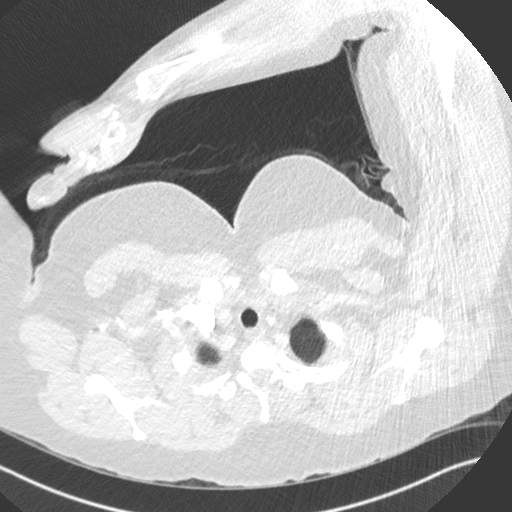
[im 375/397  soft-tissue]
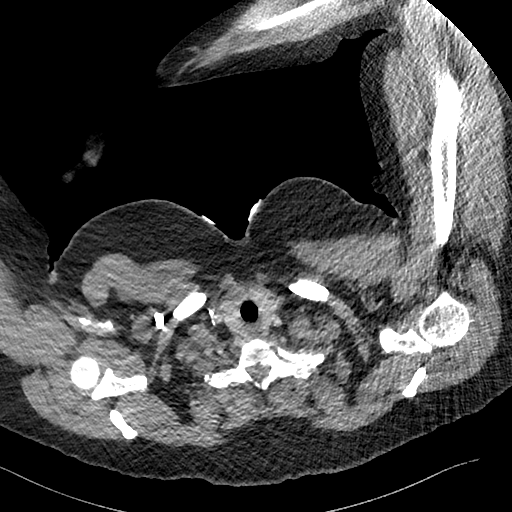

[Series 9: cor · coronal · 0.55mm/px · 3 of 161 slices shown]
[im 41/161  soft-tissue]
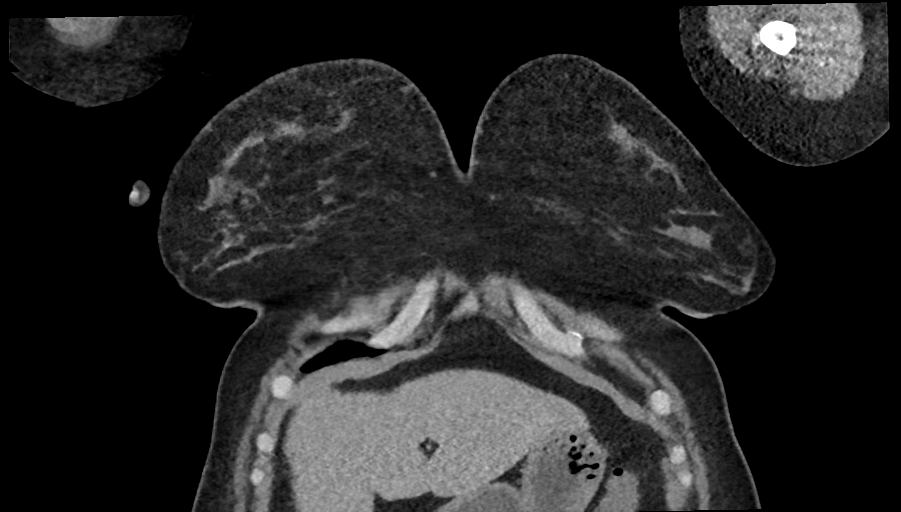
[im 81/161  soft-tissue]
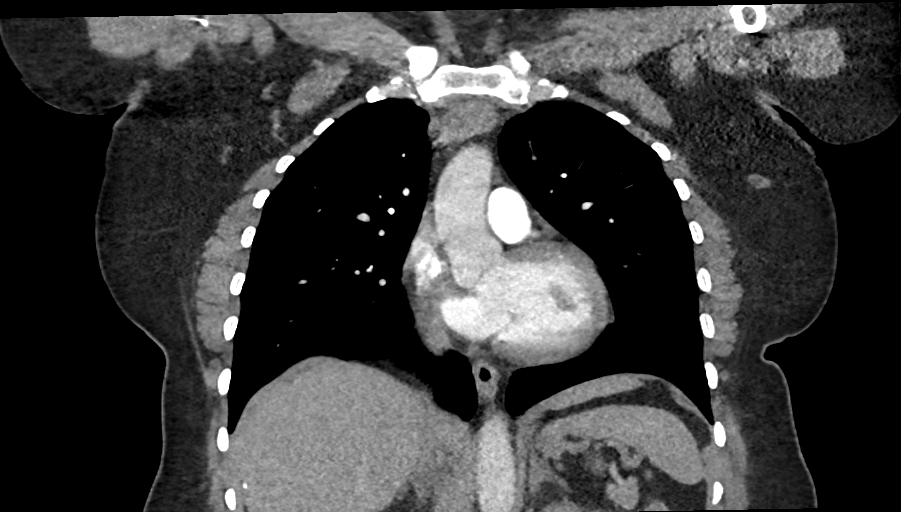
[im 121/161  soft-tissue]
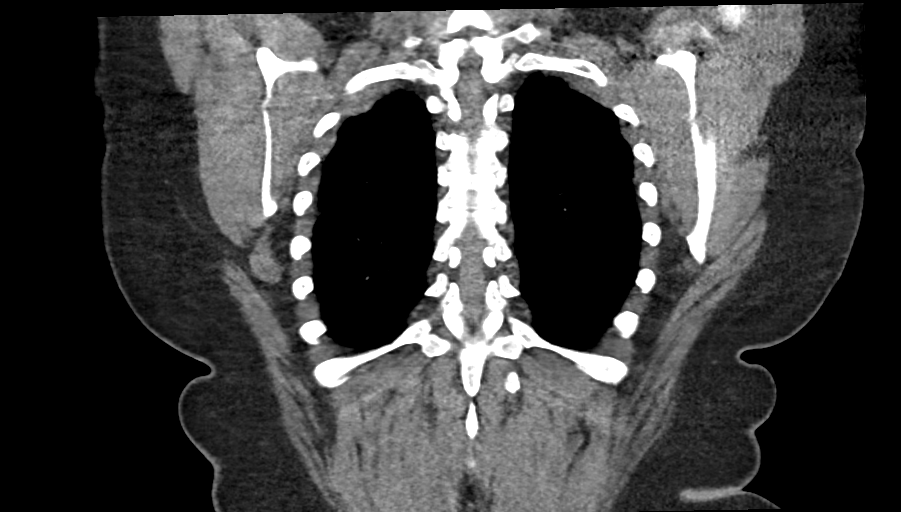

[17 of 46 positions shown; findings below may reference images not displayed]

FINDINGS: Cardiovascular: Heart is normal in size. Thoracic aorta is
unremarkable. Pulmonary arterial system is well opacified without
evidence of emboli. Remaining vascular structures are normal.

Mediastinum/Nodes: No evidence of mediastinal or hilar adenopathy.
Remaining mediastinal structures are normal.

Lungs/Pleura: Lungs are well inflated without evidence of focal
airspace consolidation or effusion. Airways are normal.

Upper Abdomen: Previous cholecystectomy.

Musculoskeletal: Unremarkable.

Review of the MIP images confirms the above findings.
IMPRESSION: No evidence of acute cardiopulmonary disease and no evidence of
pulmonary embolism.

## 2021-04-05 IMAGING — DX DG CHEST 1V PORT
1 series · 1 of 1 positions shown · non-contrast
Comparison: May 14, 2019

CLINICAL DATA: Shortness of breath

EXAM:
PORTABLE CHEST 1 VIEW

[chest pa]
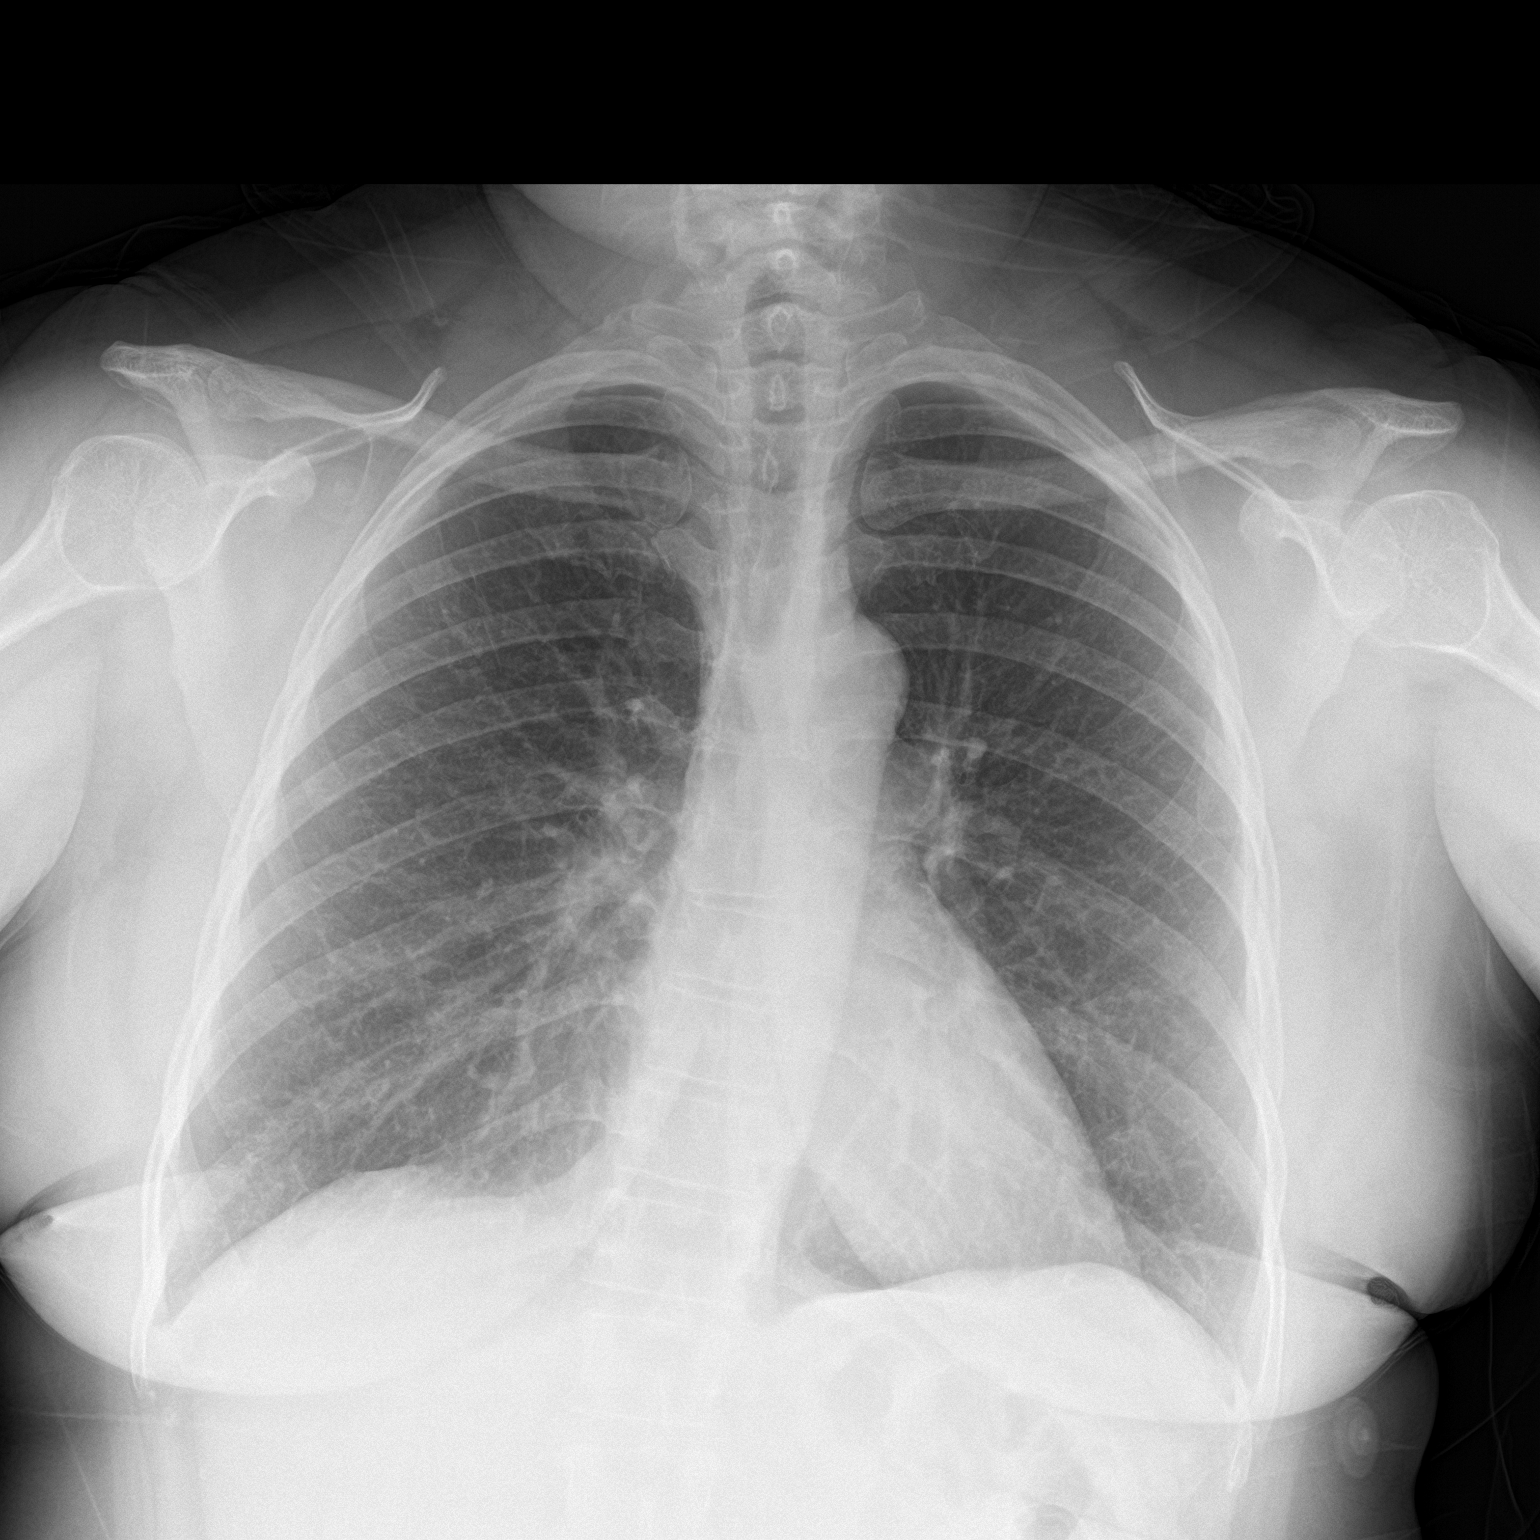

[1 of 1 positions shown; findings below may reference images not displayed]

FINDINGS: The heart size and mediastinal contours are within normal limits.
Both lungs are clear. The visualized skeletal structures are
unremarkable.
IMPRESSION: No active disease.

## 2024-05-20 ENCOUNTER — Emergency Department (HOSPITAL_BASED_OUTPATIENT_CLINIC_OR_DEPARTMENT_OTHER)
Admission: EM | Admit: 2024-05-20 | Discharge: 2024-05-20 | Disposition: A | Discharge status: Home / Self Care | Attending: Emergency Medicine | Admitting: Emergency Medicine

## 2024-05-20 ENCOUNTER — Encounter (HOSPITAL_BASED_OUTPATIENT_CLINIC_OR_DEPARTMENT_OTHER): Payer: Self-pay | Admitting: Emergency Medicine

## 2024-05-20 ENCOUNTER — Other Ambulatory Visit: Payer: Self-pay

## 2024-05-20 DIAGNOSIS — R509 Fever, unspecified: Secondary | ICD-10-CM | POA: Insufficient documentation

## 2024-05-20 DIAGNOSIS — R6889 Other general symptoms and signs: Secondary | ICD-10-CM

## 2024-05-20 MED ORDER — OSELTAMIVIR PHOSPHATE 75 MG PO CAPS: 75.0000 mg | ORAL_CAPSULE | Freq: Two times a day (BID) | ORAL | 0 refills | Status: AC

## 2024-05-20 MED ORDER — ONDANSETRON HCL 4 MG PO TABS: 4.0000 mg | ORAL_TABLET | Freq: Four times a day (QID) | ORAL | 0 refills | Status: DC | PRN

## 2024-05-20 MED ORDER — CELECOXIB 200 MG PO CAPS: 200.0000 mg | ORAL_CAPSULE | Freq: Two times a day (BID) | ORAL | 0 refills | Status: DC

## 2024-05-20 MED ORDER — ACETAMINOPHEN 500 MG PO TABS
1000.0000 mg | ORAL_TABLET | Freq: Once | ORAL | Status: AC
  Administered 2024-05-20: 1000 mg via ORAL
  Filled 2024-05-20: qty 2

## 2024-05-20 MED ORDER — CELECOXIB 200 MG PO CAPS: 200.0000 mg | ORAL_CAPSULE | Freq: Two times a day (BID) | ORAL | 0 refills | Status: AC

## 2024-05-20 MED ORDER — OSELTAMIVIR PHOSPHATE 75 MG PO CAPS: 75.0000 mg | ORAL_CAPSULE | Freq: Two times a day (BID) | ORAL | 0 refills | Status: DC

## 2024-05-20 MED ORDER — KETOROLAC TROMETHAMINE 15 MG/ML IJ SOLN
30.0000 mg | Freq: Once | INTRAMUSCULAR | Status: AC
  Administered 2024-05-20: 30 mg via INTRAMUSCULAR
  Filled 2024-05-20: qty 2

## 2024-05-20 NOTE — ED Triage Notes (Signed)
 Pt in with flu-like symptoms since last night. Pt reports chills, body aches and HA.

## 2024-05-20 NOTE — ED Provider Notes (Signed)
 " [place] [facility] DE[facility] AT [facility] Provider Note   CSN: [id number] Arrival date & time: 05/20/24  0033     History Chief Complaint  Patient presents with   Flu-Like Symptoms    HPI [name] is a 49 y.o. female presenting for chief complaint of fever/cough/congestion. Started earlier today.  Relatively insidious in onset. Worsening symptoms tonight.  No medication prior to arrival.  Otherwise ambulatory tolerating p.o. intake.   Patient's recorded medical, surgical, social, medication list and allergies were reviewed in the Snapshot window as part of the initial history.   Review of Systems   Review of Systems  Constitutional:  Negative for chills and fever.  HENT:  Positive for congestion and sore throat. Negative for ear pain.   Eyes:  Negative for pain and visual disturbance.  Respiratory:  Positive for cough. Negative for choking and shortness of breath.   Cardiovascular:  Negative for chest pain and palpitations.  Gastrointestinal:  Negative for abdominal pain and vomiting.  Genitourinary:  Negative for dysuria and hematuria.  Musculoskeletal:  Negative for arthralgias and back pain.  Skin:  Negative for color change and rash.  Neurological:  Negative for seizures and syncope.  All other systems reviewed and are negative.   Physical Exam Updated Vital Signs BP 118/75 (BP Location: Right Arm)   Pulse 86   Temp 98.8 F (37.1 C) (Oral)   Resp 18   Wt 111.1 kg   LMP  (LMP Unknown)   SpO2 99%   BMI 42.04 kg/m  Physical Exam Vitals and nursing note reviewed.  Constitutional:      General: She is not in acute distress.    Appearance: She is well-developed.  HENT:     Head: Normocephalic and atraumatic.  Eyes:     Conjunctiva/sclera: Conjunctivae normal.  Cardiovascular:     Rate and Rhythm: Normal rate and regular rhythm.     Heart sounds: No murmur heard. Pulmonary:     Effort: Pulmonary effort is normal. No respiratory  distress.     Breath sounds: Normal breath sounds.  Abdominal:     Palpations: Abdomen is soft.     Tenderness: There is no abdominal tenderness.  Musculoskeletal:        General: No swelling.     Cervical back: Neck supple.  Skin:    General: Skin is warm and dry.     Capillary Refill: Capillary refill takes less than 2 seconds.  Neurological:     Mental Status: She is alert.  Psychiatric:        Mood and Affect: Mood normal.      ED Course/ Medical Decision Making/ A&P    Procedures Procedures   Medications Ordered in ED Medications  ketorolac (TORADOL) 15 MG/ML injection 30 mg (has no administration in time range)  acetaminophen  (TYLENOL ) tablet 1,000 mg (1,000 mg Oral Given 05/20/24 0138)   Medical Decision Making:   [name] is a 49 y.o. female who presented to the ED today with subjective fever, cough, congestion detailed above.    Additional history discussed with patient's family/caregivers.  Patient placed on continuous vitals and telemetry monitoring while in ED which was reviewed periodically.   Complete initial physical exam performed, notably the patient  was hemodynamically stable in no acute distress.  Posterior oropharynx illuminated and without obvious swelling or deformity.  Patient is without neck stiffness.    Reviewed and confirmed nursing documentation for past medical history, family history, social history.  Initial Assessment:   With the patient's presentation of fever cough congestion, most likely diagnosis is developing viral upper respiratory infection. Other diagnoses were considered including (but not limited to) peritonsillar abscess, retropharyngeal abscess, pneumonia. These are considered less likely due to history of present illness and physical exam findings.   This is most consistent with an acute complicated illness Considered meningitis, however patient's symptoms, vital signs, physical exam findings including lack of meningismus  seem grossly less consistent at this time. Initial Plan:  Empiric treatment with antipyretics including acetaminophen  in ambulatory setting Will augment with dose of ketorolac in ED  Objective evaluation as below reviewed     Final Assessment and Plan:   On reassessment, patient is ambulatory tolerating p.o. intake in no acute distress.   Clinically, patient is within the flu treatment window.  Will start on Tamiflu and supportive care and recommend follow-up with PCP.  Patient is currently stable for outpatient care and management with no indication for hospitalization or transfer at this time.  Discussed all findings with patient expressed understanding.  Disposition:  Based on the above findings, I believe patient is stable for discharge.    Patient/family educated about specific return precautions for given chief complaint and symptoms.  Patient/family educated about follow-up with PCP.     Patient/family expressed understanding of return precautions and need for follow-up. Patient spoken to regarding all imaging and laboratory results and appropriate follow up for these results. All education provided in verbal form with additional information in written form. Time was allowed for answering of patient questions. Patient discharged.    Emergency Department Medication Summary:   Medications  ketorolac (TORADOL) 15 MG/ML injection 30 mg (has no administration in time range)  acetaminophen  (TYLENOL ) tablet 1,000 mg (1,000 mg Oral Given 05/20/24 0138)           Clinical Impression:  1. Flu-like symptoms      Discharge   Clinical Impression:  1. Flu-like symptoms      Discharge   Final Clinical Impression(s) / ED Diagnoses Final diagnoses:  Flu-like symptoms    Rx / DC Orders ED Discharge Orders          Ordered    oseltamivir (TAMIFLU) 75 MG capsule  Every 12 hours        05/20/24 0127    ondansetron (ZOFRAN) 4 MG tablet  Every 6 hours PRN        05/20/24 0127     celecoxib (CELEBREX) 200 MG capsule  2 times daily        05/20/24 0128              Jerral Meth, MD 05/20/24 0140  "

## 2024-06-25 ENCOUNTER — Other Ambulatory Visit: Payer: Self-pay

## 2024-06-25 ENCOUNTER — Emergency Department (HOSPITAL_COMMUNITY)

## 2024-06-25 ENCOUNTER — Emergency Department (HOSPITAL_COMMUNITY)
Admission: EM | Admit: 2024-06-25 | Discharge: 2024-06-25 | Disposition: A | Discharge status: Home / Self Care | Source: Home / Self Care | Attending: Emergency Medicine | Admitting: Emergency Medicine

## 2024-06-25 DIAGNOSIS — S82891A Other fracture of right lower leg, initial encounter for closed fracture: Secondary | ICD-10-CM

## 2024-06-25 MED ORDER — KETAMINE HCL 50 MG/5ML IJ SOSY
1.0000 mg/kg | PREFILLED_SYRINGE | Freq: Once | INTRAMUSCULAR | Status: AC
  Administered 2024-06-25: 50 mg via INTRAVENOUS
  Filled 2024-06-25: qty 15

## 2024-06-25 MED ORDER — PROPOFOL 10 MG/ML IV BOLUS
0.5000 mg/kg | Freq: Once | INTRAVENOUS | Status: AC
  Administered 2024-06-25: 50 mg via INTRAVENOUS
  Filled 2024-06-25: qty 20

## 2024-06-25 MED ORDER — ONDANSETRON 4 MG PO TBDP
4.0000 mg | ORAL_TABLET | Freq: Once | ORAL | Status: AC
  Administered 2024-06-25: 4 mg via ORAL
  Filled 2024-06-25: qty 1

## 2024-06-25 MED ORDER — OXYCODONE-ACETAMINOPHEN 5-325 MG PO TABS
1.0000 | ORAL_TABLET | Freq: Once | ORAL | Status: AC
  Administered 2024-06-25: 1 via ORAL
  Filled 2024-06-25: qty 1

## 2024-06-25 MED ORDER — OXYCODONE HCL 5 MG PO TABS: 5.0000 mg | ORAL_TABLET | ORAL | 0 refills | Status: AC | PRN

## 2024-06-25 MED ORDER — ONDANSETRON HCL 4 MG PO TABS: 4.0000 mg | ORAL_TABLET | Freq: Four times a day (QID) | ORAL | 0 refills | Status: AC

## 2024-06-25 NOTE — ED Provider Notes (Addendum)
 " [place] EMERGENCY DEPARTMENT AT [facility] Provider Note   CSN: [id number] Arrival date & time: 06/25/24  9068     Patient presents with: [place]   [name] is a 49 y.o. female.   49 year old female here today after a slip and fall on some ice today.  She had immediate pain in her right ankle.  No head strike, denies any pain in her upper extremities, chest or abdomen.  Not on blood thinners.   Fall       Prior to Admission medications  Medication Sig Start Date End Date Taking? Authorizing Provider  albuterol (VENTOLIN HFA) 108 (90 Base) MCG/ACT inhaler Inhale 1-2 puffs into the lungs every 4 (four) hours as needed for wheezing.  05/11/19   [provider]  benzonatate (TESSALON) 200 MG capsule Take 200 mg by mouth 3 (three) times daily as needed for cough.  05/11/19   [provider]  celecoxib  (CELEBREX ) 200 MG capsule Take 1 capsule (200 mg total) by mouth 2 (two) times daily. 05/20/24   Jerral Meth, MD  furosemide (LASIX) 20 MG tablet Take 20 mg by mouth daily as needed for fluid.  02/25/19   [provider]  HYDROcodone -homatropine (HYCODAN) 5-1.5 MG/5ML syrup Take 5 mLs by mouth every 6 (six) hours as needed for cough. Patient not taking: Reported on 05/16/2019 06/10/18   Rolinda Rogue, MD  lisinopril-hydrochlorothiazide (PRINZIDE,ZESTORETIC) 20-12.5 MG per tablet Take 1 tablet by mouth daily.    [provider]  LORazepam  (ATIVAN ) 1 MG tablet Take 1 tablet (1 mg total) by mouth 3 (three) times daily as needed for anxiety. 05/16/19   Charlyn Sora, MD  medroxyPROGESTERone (PROVERA) 10 MG tablet Take 10 mg by mouth daily. 04/27/19   [provider]  ondansetron  (ZOFRAN ) 4 MG tablet Take 1 tablet (4 mg total) by mouth every 6 (six) hours as needed for up to 30 doses for nausea or vomiting. 05/20/24   Jerral Meth, MD  oseltamivir  (TAMIFLU ) 75 MG capsule Take 1 capsule (75 mg total) by mouth every 12 (twelve)  hours. 05/20/24   Jerral Meth, MD  polyethylene glycol (MIRALAX ) packet Take 17 g by mouth daily. Patient not taking: Reported on 05/16/2019 05/08/14   Flint Sonny POUR, PA-C  potassium chloride  (KLOR-CON ) 10 MEQ tablet Take 1 tablet (10 mEq total) by mouth daily for 3 days. 05/14/19 05/17/19  Muthersbaugh, Chiquita, PA-C    Allergies: Patient has no known allergies.    Review of Systems  Updated Vital Signs BP (!) 131/90 (BP Location: Right Arm)   Pulse 76   Temp 98.2 F (36.8 C) (Oral)   Resp 18   LMP 06/14/2024 (Approximate)   SpO2 97%   Physical Exam Vitals and nursing note reviewed.  Constitutional:      Appearance: She is not toxic-appearing.  HENT:     Head: Normocephalic and atraumatic.  Eyes:     Pupils: Pupils are equal, round, and reactive to light.  Cardiovascular:     Rate and Rhythm: Normal rate.     Pulses: Normal pulses.  Pulmonary:     Effort: Pulmonary effort is normal.  Abdominal:     General: Abdomen is flat.  Musculoskeletal:     Comments: Right lower extremity-significant swelling of the lateral medial malleolus.  Point tenderness of the lateral medial malleoli.  Patient does have some tenderness in the midshaft of the right lower extremity, predominantly on the lateral aspect.   Skin:    Comments: There  is an abrasion on the left lower extremity, does not appear to be open or communicating with fracture.  Superficial.  Neurological:     Mental Status: She is alert.     Comments: Patient able to wiggle toes.     (all labs ordered are listed, but only abnormal results are displayed) Labs Reviewed - No data to display  EKG: None  Radiology: No results found.   .Reduction of fracture  Date/Time: 06/25/2024 6:19 PM  Performed by: Mannie Fairy DASEN, DO Authorized by: Mannie Fairy DASEN, DO  Consent: Written consent obtained Risks and benefits: risks, benefits and alternatives were discussed Consent given by: patient Patient understanding:  patient states understanding of the procedure being performed Patient consent: the patient's understanding of the procedure matches consent given Procedure consent: procedure consent matches procedure scheduled Relevant documents: relevant documents present and verified Imaging studies: imaging studies available Patient identity confirmed: verbally with patient Time out: Immediately prior to procedure a time out was called to verify the correct patient, procedure, equipment, support staff and site/side marked as required.  Sedation: Patient sedated: yes Sedatives: propofol  and ketamine   Patient tolerance: patient tolerated the procedure well with no immediate complications   .Sedation  Date/Time: 06/25/2024 6:20 PM  Performed by: Mannie Fairy DASEN, DO Authorized by: Mannie Fairy DASEN, DO   Consent:    Consent obtained:  Written   Consent given by:  Patient   Risks discussed:  Allergic reaction and respiratory compromise necessitating ventilatory assistance and intubation Universal protocol:    Procedure explained and questions answered to patient or proxy's satisfaction: yes     Immediately prior to procedure, a time out was called: yes     Patient identity confirmed:  Verbally with patient Indications:    Procedure performed:  Fracture reduction   Procedure necessitating sedation performed by:  Physician performing sedation Pre-sedation assessment:    Time since last food or drink:  5   ASA classification: class 2 - patient with mild systemic disease     Mouth opening:  3 or more finger widths   Thyromental distance:  3 finger widths   Mallampati score:  II - soft palate, uvula, fauces visible   Neck mobility: normal     Pre-sedation assessments completed and reviewed: airway patency, cardiovascular function, mental status, nausea/vomiting and pain level   A pre-sedation assessment was completed prior to the start of the procedure Immediate pre-procedure details:     Reviewed: vital signs     Verified: bag valve mask available   Procedure details (see MAR for exact dosages):    Preoxygenation:  Nasal cannula   Sedation:  Propofol  and ketamine    Intended level of sedation: deep   Intra-procedure monitoring:  Blood pressure monitoring and continuous capnometry   Intra-procedure events: respiratory depression     Total Provider sedation time (minutes):  18 Post-procedure details:   A post-sedation assessment was completed following the completion of the procedure.   Attendance: Constant attendance by certified staff until patient recovered     Recovery: Patient returned to pre-procedure baseline     Patient is stable for discharge or admission: yes     Procedure completion:  Tolerated well, no immediate complications Comments:     50mg  ketamine  followed by 50mg  propofol . Patient had very brief period of apnea, less than 30 seconds, respiratory provided blowby with improvement in pulse ox, followed by return of spontaneous respirations. Patient returned to baseline, tolerated procedure well.    Medications Ordered in  the ED  oxyCODONE -acetaminophen  (PERCOCET/ROXICET) 5-325 MG per tablet 1 tablet (has no administration in time range)  ondansetron  (ZOFRAN -ODT) disintegrating tablet 4 mg (has no administration in time range)                                    Medical Decision Making 49 year old female here today with right ankle pain after a slip and fall.  Differential diagnoses include ankle sprain, ankle fracture.  Plan-patient neurovascularly intact.  Plain films of the ankle and tib-fib ordered.  Analgesia provided.  No other traumatic injuries on exam.  Amount and/or Complexity of Data Reviewed Radiology: ordered.  Risk Prescription drug management.       Final diagnoses:  None    ED Discharge Orders     None          Mannie Fairy DASEN, DO 06/25/24 1234    Mannie Fairy T, DO 06/25/24 1824  "

## 2024-06-25 NOTE — ED Triage Notes (Signed)
 Fall on ice today with pain and swelling to right ankle, pt heard a crack sound. Pt give 50 mcg fent.

## 2024-06-25 NOTE — Progress Notes (Signed)
 Orthopedic Tech Progress Note Patient Details:  [name] Jul 17, 1975 [id number]  Ortho Devices Type of Ortho Device: Post (short leg) splint, Stirrup splint Ortho Device/Splint Location: RLE Ortho Device/Splint Interventions: Application   Post Interventions Patient Tolerated: Well  [name] 06/25/2024, 12:35 PM

## 2024-06-25 NOTE — Discharge Instructions (Signed)
 While you were in the emergency room, you had your ankle fracture reduced.  You need to use crutches to get around, and you will require surgery on this fracture.  You can go to Dr. Fransisco office at 8:30 AM on Monday.  Take 1000 mg of Tylenol  every 8 hours, 400 mg of ibuprofen every 6 hours.  You may take 5 mg of oxycodone  as needed for severe pain.  Return to the emergency room if you develop sudden worsening pain in your leg or ankle, inability to feel your leg or ankle or inability to move your toes.
